# Patient Record
Sex: Male | Born: 1950 | Race: White | Hispanic: No | Marital: Single | Smoking: Never smoker
Health system: Southern US, Community
[De-identification: ages and names within clinical notes are randomized; demographics above are authoritative.]

## PROBLEM LIST (undated history)

## (undated) DIAGNOSIS — R0981 Nasal congestion: Secondary | ICD-10-CM

## (undated) DIAGNOSIS — I2510 Atherosclerotic heart disease of native coronary artery without angina pectoris: Secondary | ICD-10-CM

## (undated) DIAGNOSIS — E785 Hyperlipidemia, unspecified: Secondary | ICD-10-CM

## (undated) DIAGNOSIS — I5022 Chronic systolic (congestive) heart failure: Secondary | ICD-10-CM

## (undated) DIAGNOSIS — I219 Acute myocardial infarction, unspecified: Secondary | ICD-10-CM

## (undated) DIAGNOSIS — I493 Ventricular premature depolarization: Secondary | ICD-10-CM

## (undated) DIAGNOSIS — G629 Polyneuropathy, unspecified: Secondary | ICD-10-CM

## (undated) DIAGNOSIS — J45909 Unspecified asthma, uncomplicated: Secondary | ICD-10-CM

## (undated) DIAGNOSIS — J339 Nasal polyp, unspecified: Secondary | ICD-10-CM

## (undated) DIAGNOSIS — J301 Allergic rhinitis due to pollen: Secondary | ICD-10-CM

## (undated) DIAGNOSIS — I255 Ischemic cardiomyopathy: Secondary | ICD-10-CM

## (undated) HISTORY — DX: Chronic systolic (congestive) heart failure: I50.22

## (undated) HISTORY — DX: Polyneuropathy, unspecified: G62.9

## (undated) HISTORY — DX: Unspecified asthma, uncomplicated: J45.909

## (undated) HISTORY — DX: Ventricular premature depolarization: I49.3

## (undated) HISTORY — DX: Ischemic cardiomyopathy: I25.5

## (undated) HISTORY — DX: Nasal polyp, unspecified: J33.9

## (undated) HISTORY — DX: Atherosclerotic heart disease of native coronary artery without angina pectoris: I25.10

## (undated) HISTORY — DX: Acute myocardial infarction, unspecified: I21.9

## (undated) HISTORY — DX: Allergic rhinitis due to pollen: J30.1

## (undated) HISTORY — PX: NASAL POLYP SURGERY: SHX186

## (undated) HISTORY — DX: Nasal congestion: R09.81

## (undated) HISTORY — DX: Hyperlipidemia, unspecified: E78.5

## (undated) HISTORY — DX: Gilbert syndrome: E80.4

---

## 2012-12-28 ENCOUNTER — Inpatient Hospital Stay: Payer: Self-pay | Admitting: Internal Medicine

## 2012-12-28 DIAGNOSIS — I214 Non-ST elevation (NSTEMI) myocardial infarction: Secondary | ICD-10-CM

## 2012-12-28 LAB — CBC
HGB: 14.6 g/dL (ref 13.0–18.0)
MCHC: 34.3 g/dL (ref 32.0–36.0)
MCV: 94 fL (ref 80–100)
Platelet: 165 10*3/uL (ref 150–440)
RBC: 4.56 10*6/uL (ref 4.40–5.90)
RDW: 12.7 % (ref 11.5–14.5)
WBC: 10.2 10*3/uL (ref 3.8–10.6)

## 2012-12-28 LAB — URINALYSIS, COMPLETE
Glucose,UR: NEGATIVE mg/dL (ref 0–75)
Protein: NEGATIVE
WBC UR: 6 /HPF (ref 0–5)

## 2012-12-28 LAB — COMPREHENSIVE METABOLIC PANEL
Albumin: 3.6 g/dL (ref 3.4–5.0)
Alkaline Phosphatase: 99 U/L (ref 50–136)
Anion Gap: 7 (ref 7–16)
Bilirubin,Total: 1.3 mg/dL — ABNORMAL HIGH (ref 0.2–1.0)
Calcium, Total: 8.5 mg/dL (ref 8.5–10.1)
Co2: 25 mmol/L (ref 21–32)
Creatinine: 0.79 mg/dL (ref 0.60–1.30)
EGFR (African American): >60
EGFR (Non-African Amer.): >60
Glucose: 103 mg/dL — ABNORMAL HIGH (ref 65–99)
Osmolality: 274 (ref 275–301)
Potassium: 3.5 mmol/L (ref 3.5–5.1)
SGOT(AST): 116 U/L — ABNORMAL HIGH (ref 15–37)
SGPT (ALT): 47 U/L (ref 12–78)
Total Protein: 6.7 g/dL (ref 6.4–8.2)

## 2012-12-28 LAB — CK TOTAL AND CKMB (NOT AT ARMC)
CK, Total: 702 U/L — ABNORMAL HIGH (ref 35–232)
CK, Total: 1176 U/L — ABNORMAL HIGH (ref 35–232)
CK-MB: 169.8 ng/mL — ABNORMAL HIGH (ref 0.5–3.6)

## 2012-12-28 LAB — TROPONIN I: Troponin-I: 30.98 ng/mL — ABNORMAL HIGH

## 2012-12-29 DIAGNOSIS — I2510 Atherosclerotic heart disease of native coronary artery without angina pectoris: Secondary | ICD-10-CM

## 2012-12-29 HISTORY — PX: CARDIAC CATHETERIZATION: SHX172

## 2012-12-29 LAB — APTT
Activated PTT: 114.7 secs — ABNORMAL HIGH (ref 23.6–35.9)
Activated PTT: 116.3 secs — ABNORMAL HIGH (ref 23.6–35.9)
Activated PTT: 121 secs — ABNORMAL HIGH (ref 23.6–35.9)

## 2012-12-29 LAB — CBC WITH DIFFERENTIAL/PLATELET
Basophil #: 0 10*3/uL (ref 0.0–0.1)
Basophil %: 0.2 %
HCT: 41.6 % (ref 40.0–52.0)
HGB: 14.5 g/dL (ref 13.0–18.0)
Lymphocyte #: 1.1 10*3/uL (ref 1.0–3.6)
Lymphocyte %: 9.3 %
MCH: 32.8 pg (ref 26.0–34.0)
Monocyte #: 0.8 x10 3/mm (ref 0.2–1.0)
Monocyte %: 6.7 %
Platelet: 152 10*3/uL (ref 150–440)
RBC: 4.44 10*6/uL (ref 4.40–5.90)
WBC: 12.3 10*3/uL — ABNORMAL HIGH (ref 3.8–10.6)

## 2012-12-29 LAB — CK TOTAL AND CKMB (NOT AT ARMC)
CK, Total: 1226 U/L — ABNORMAL HIGH (ref 35–232)
CK, Total: 617 U/L — ABNORMAL HIGH (ref 35–232)
CK-MB: 134.2 ng/mL — ABNORMAL HIGH (ref 0.5–3.6)

## 2012-12-30 DIAGNOSIS — I214 Non-ST elevation (NSTEMI) myocardial infarction: Secondary | ICD-10-CM

## 2012-12-30 LAB — BASIC METABOLIC PANEL
Anion Gap: 8 (ref 7–16)
BUN: 11 mg/dL (ref 7–18)
Calcium, Total: 8 mg/dL — ABNORMAL LOW (ref 8.5–10.1)
Chloride: 108 mmol/L — ABNORMAL HIGH (ref 98–107)
Co2: 24 mmol/L (ref 21–32)
EGFR (African American): >60
EGFR (Non-African Amer.): >60
Osmolality: 278 (ref 275–301)
Sodium: 140 mmol/L (ref 136–145)

## 2012-12-30 LAB — PLATELET COUNT: Platelet: 129 10*3/uL — ABNORMAL LOW (ref 150–440)

## 2013-01-04 ENCOUNTER — Encounter: Payer: Self-pay | Admitting: *Deleted

## 2013-01-04 ENCOUNTER — Telehealth: Payer: Self-pay

## 2013-01-04 NOTE — Telephone Encounter (Signed)
Message copied by [facility], Jacobs Golab E on Wed Jan 04, 2013  9:20 AM ------      Message from: Iverson Alamin C      Created: Wed Jan 04, 2013  9:18 AM       TCM      Discharged from [facility]:01/01/13      Appointment: Kirke Corin 01/12/13      Records printed. ------

## 2013-01-04 NOTE — Telephone Encounter (Signed)
TCM  

## 2013-01-04 NOTE — Telephone Encounter (Signed)
Missed TCM window

## 2013-01-11 ENCOUNTER — Encounter: Payer: Self-pay | Admitting: Cardiovascular Disease

## 2013-01-11 ENCOUNTER — Other Ambulatory Visit: Payer: Self-pay

## 2013-01-11 ENCOUNTER — Ambulatory Visit (INDEPENDENT_AMBULATORY_CARE_PROVIDER_SITE_OTHER): Payer: BC Managed Care – PPO | Admitting: Physician Assistant

## 2013-01-11 ENCOUNTER — Encounter: Payer: Self-pay | Admitting: Physician Assistant

## 2013-01-11 VITALS — BP 100/68 | HR 60 | Ht 68.0 in | Wt 160.5 lb

## 2013-01-11 DIAGNOSIS — I2510 Atherosclerotic heart disease of native coronary artery without angina pectoris: Secondary | ICD-10-CM

## 2013-01-11 DIAGNOSIS — E785 Hyperlipidemia, unspecified: Secondary | ICD-10-CM | POA: Insufficient documentation

## 2013-01-11 DIAGNOSIS — I2589 Other forms of chronic ischemic heart disease: Secondary | ICD-10-CM

## 2013-01-11 DIAGNOSIS — J45901 Unspecified asthma with (acute) exacerbation: Secondary | ICD-10-CM

## 2013-01-11 DIAGNOSIS — J45909 Unspecified asthma, uncomplicated: Secondary | ICD-10-CM | POA: Insufficient documentation

## 2013-01-11 DIAGNOSIS — J441 Chronic obstructive pulmonary disease with (acute) exacerbation: Secondary | ICD-10-CM

## 2013-01-11 DIAGNOSIS — I255 Ischemic cardiomyopathy: Secondary | ICD-10-CM | POA: Insufficient documentation

## 2013-01-11 DIAGNOSIS — I429 Cardiomyopathy, unspecified: Secondary | ICD-10-CM

## 2013-01-11 MED ORDER — NITROGLYCERIN 0.4 MG SL SUBL: 0.4000 mg | SUBLINGUAL_TABLET | SUBLINGUAL | Status: AC | PRN

## 2013-01-11 MED ORDER — LISINOPRIL 5 MG PO TABS: 5.0000 mg | ORAL_TABLET | Freq: Every day | ORAL | Status: DC

## 2013-01-11 MED ORDER — METOPROLOL TARTRATE 25 MG PO TABS: 12.5000 mg | ORAL_TABLET | Freq: Two times a day (BID) | ORAL | Status: DC

## 2013-01-11 MED ORDER — PRASUGREL HCL 10 MG PO TABS: 10.0000 mg | ORAL_TABLET | Freq: Every day | ORAL | Status: DC

## 2013-01-11 MED ORDER — ATORVASTATIN CALCIUM 80 MG PO TABS: 80.0000 mg | ORAL_TABLET | Freq: Every day | ORAL | Status: DC

## 2013-01-11 NOTE — Assessment & Plan Note (Signed)
Stable. Continue ICS + SABA PRN.

## 2013-01-11 NOTE — Progress Notes (Signed)
Patient ID: [name] [name], male    DOB: [birth year], 62 y.o.   MRN: [id number]  HPI Comments: [name] [name] is a 62 yo male who presents today for post-hospital follow-up. 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  He presented to [facility] on 12/28/12 c/o chest discomfort. He had jumped off his bleachers and experienced mid scapular pain. He thought he pulled a muscle, but when the pain persisted, he presented to the ED. He ruled in for NSTEMI and underwent cardiac catheterization revealing 80% prox LAD confirmed by IVUS s/p DES, 90% diagonal s/p DES, 30% prox RCA; EF 40%, mild anterolateral HK and mod apical HK. DAPT- ASA/Effient x 12 months recommended. He tolerated the procedure well and was discharged on 12/31/12.  He was diagnosed with a UTI and discharged on Levaquin.   EKG demonstrates NSR, 60 bpm, ST dep with TWIs II, III, aVF (more pronounced from 01/24/2013 tracing), TWIs V5, V6, isolated 0.5 mm ST elevation aVL (unchanged from 01/24/13 tracing)  Labwork:  TSH 1.11 AST 116, LFTs otherwise normal  Lipid panel 10/31/12 LDL 103, HDL 56, TG 37, TC 167.   He has been doing well since. He finished the Levaquin course. No urinary changes. He has resumed usual activity level- mostly yardwork and chores around the house- without incident. He denies exertional chest pain, DOE/SOB, PND, orthopnea, LE edema or weight changes. Taking all discharge medications, tolerating well.    Outpatient Encounter Prescriptions as of 01/11/2013  Medication Sig Dispense Refill  . albuterol (PROAIR HFA) 108 (90 BASE) MCG/ACT inhaler Inhale 2 puffs into the lungs as needed for wheezing.      Marland Kitchen aspirin 81 MG tablet Take 81 mg by mouth daily.      Marland Kitchen atorvastatin (LIPITOR) 40 MG tablet Take 1 tablet (40 mg total) by mouth daily.  30 tablet  3  . beclomethasone (QVAR) 80 MCG/ACT inhaler Inhale 2 puffs into the lungs 2 (two) times daily.      . budesonide  (PULMICORT) 0.5 MG/2ML nebulizer solution Take 2 mLs by nebulization daily.      Marland Kitchen ibuprofen (ADVIL,MOTRIN) 200 MG tablet Take 200 mg by mouth every 4 (four) hours as needed for pain.      Marland Kitchen lisinopril (PRINIVIL,ZESTRIL) 5 MG tablet Take 1 tablet (5 mg total) by mouth daily.  30 tablet  3  . loratadine (CLARITIN) 10 MG tablet Take 10 mg by mouth daily.      . metoprolol tartrate (LOPRESSOR) 25 MG tablet Take 0.5 tablets (12.5 mg total) by mouth 2 (two) times daily.  30 tablet  3  . nitroGLYCERIN (NITROSTAT) 0.4 MG SL tablet Place 1 tablet (0.4 mg total) under the tongue every 5 (five) minutes as needed for chest pain.  25 tablet  3  . prasugrel (EFFIENT) 10 MG TABS Take 1 tablet (10 mg total) by mouth daily.  30 tablet  3                                                           No facility-administered encounter medications on file as of 01/11/2013.      Review of Systems  Constitutional: Negative.   HENT: Negative.   Eyes: Negative.   Respiratory: Negative.   Cardiovascular: Negative.   Gastrointestinal: Negative.  Endocrine: Negative.   Genitourinary: Negative.   Musculoskeletal: Negative.   Skin: Negative.   Allergic/Immunologic: Negative.   Neurological: Negative.   Hematological: Negative.   Psychiatric/Behavioral: Negative.       Physical Exam  Constitutional: He is oriented to person, place, and time. He appears well-developed and well-nourished. No distress.  HENT:  Head: Normocephalic and atraumatic.  Eyes: Conjunctivae and EOM are normal. Pupils are equal, round, and reactive to light.  Neck: Normal range of motion. Neck supple. No JVD present. No tracheal deviation present.  Cardiovascular: Normal rate, regular rhythm, normal heart sounds and intact distal pulses.  Exam reveals no gallop and no friction rub.   No murmur heard. Pulmonary/Chest: Effort normal and breath sounds normal. No stridor. No respiratory distress. He has no wheezes. He has no rales. He  exhibits no tenderness.  Abdominal: Soft. Bowel sounds are normal. He exhibits no distension and no mass. There is no tenderness. There is no rebound and no guarding.  Musculoskeletal: Normal range of motion. He exhibits no edema and no tenderness.  Neurological: He is alert and oriented to person, place, and time.  Skin: Skin is warm and dry. No rash noted. No erythema.  Psychiatric: He has a normal mood and affect. His behavior is normal. Judgment and thought content normal.

## 2013-01-11 NOTE — Patient Instructions (Addendum)
Please increase Lipitor to 80mg  daily. Finish current bottle of Lipitor. A prescription for 80mg  tablets has been sent to your pharmacy as well.   We will check labwork today looking at your potassium and kidney function.   Please call the office with any new symptoms since increasing the Lipitor, chest pain, breathing changes or swelling/weight changes.   We will plan to perform a repeat echocardiogram (heart ultrasound) in 3 months.   Please have liver markers checked at your primary doctor's office.

## 2013-01-12 ENCOUNTER — Encounter: Payer: BC Managed Care – PPO | Admitting: Cardiovascular Disease

## 2013-01-12 LAB — BASIC METABOLIC PANEL
BUN: 16 mg/dL (ref 8–27)
CO2: 26 mmol/L (ref 19–28)
Chloride: 102 mmol/L (ref 97–108)
Glucose: 87 mg/dL (ref 65–99)

## 2013-01-19 ENCOUNTER — Encounter: Payer: Self-pay | Admitting: Cardiovascular Disease

## 2013-02-19 ENCOUNTER — Encounter: Payer: Self-pay | Admitting: Cardiovascular Disease

## 2013-03-21 ENCOUNTER — Encounter: Payer: Self-pay | Admitting: Cardiovascular Disease

## 2013-04-18 ENCOUNTER — Other Ambulatory Visit: Payer: Self-pay

## 2013-04-18 ENCOUNTER — Other Ambulatory Visit (INDEPENDENT_AMBULATORY_CARE_PROVIDER_SITE_OTHER): Payer: BC Managed Care – PPO

## 2013-04-18 ENCOUNTER — Encounter: Payer: Self-pay | Admitting: Cardiovascular Disease

## 2013-04-18 DIAGNOSIS — I2510 Atherosclerotic heart disease of native coronary artery without angina pectoris: Secondary | ICD-10-CM

## 2013-04-18 DIAGNOSIS — I255 Ischemic cardiomyopathy: Secondary | ICD-10-CM

## 2013-05-19 ENCOUNTER — Ambulatory Visit (INDEPENDENT_AMBULATORY_CARE_PROVIDER_SITE_OTHER): Payer: BC Managed Care – PPO | Admitting: Cardiovascular Disease

## 2013-05-19 ENCOUNTER — Encounter: Payer: Self-pay | Admitting: Cardiovascular Disease

## 2013-05-19 VITALS — BP 108/68 | HR 56 | Ht 68.0 in | Wt 151.5 lb

## 2013-05-19 DIAGNOSIS — I2510 Atherosclerotic heart disease of native coronary artery without angina pectoris: Secondary | ICD-10-CM

## 2013-05-19 DIAGNOSIS — I2589 Other forms of chronic ischemic heart disease: Secondary | ICD-10-CM

## 2013-05-19 DIAGNOSIS — I255 Ischemic cardiomyopathy: Secondary | ICD-10-CM

## 2013-05-19 DIAGNOSIS — E785 Hyperlipidemia, unspecified: Secondary | ICD-10-CM

## 2013-05-19 NOTE — Patient Instructions (Addendum)
Continue same medications.  Follow up in 6 months.  

## 2013-05-19 NOTE — Progress Notes (Signed)
HPI   [name] is a 62 yo male who is here today for a followup visit regarding coronary artery disease with previous stenting of the LAD/diagonal. 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  He presented to [facility] on 12/28/12 c/o chest and mid scapular discomfort. He ruled in for NSTEMI and underwent cardiac catheterization revealing 80% prox LAD confirmed by IVUS s/p DES, 90% diagonal s/p DES, 30% prox RCA; EF 40%, mild anterolateral HK and mod apical HK. DAPT- ASA/Effient x 12 months recommended.  He has been doing very well since then with no recurrent symptoms of chest pain or dyspnea. Echocardiogram in July of 2014 showed an ejection fraction of 40-45% with mild mitral and aortic regurgitation.  No Known Allergies   Current Outpatient Prescriptions on File Prior to Visit  Medication Sig Dispense Refill  . albuterol (PROAIR HFA) 108 (90 BASE) MCG/ACT inhaler Inhale 2 puffs into the lungs as needed for wheezing.      Marland Kitchen aspirin 81 MG tablet Take 81 mg by mouth daily.      Marland Kitchen atorvastatin (LIPITOR) 80 MG tablet Take 1 tablet (80 mg total) by mouth daily.  30 tablet  3  . beclomethasone (QVAR) 80 MCG/ACT inhaler Inhale 2 puffs into the lungs 2 (two) times daily.      . budesonide (PULMICORT) 0.5 MG/2ML nebulizer solution Take 2 mLs by nebulization daily.      Marland Kitchen ibuprofen (ADVIL,MOTRIN) 200 MG tablet Take 200 mg by mouth every 4 (four) hours as needed for pain.      Marland Kitchen lisinopril (PRINIVIL,ZESTRIL) 5 MG tablet Take 1 tablet (5 mg total) by mouth daily.  30 tablet  3  . loratadine (CLARITIN) 10 MG tablet Take 10 mg by mouth daily.      . metoprolol tartrate (LOPRESSOR) 25 MG tablet Take 0.5 tablets (12.5 mg total) by mouth 2 (two) times daily.  30 tablet  3  . nitroGLYCERIN (NITROSTAT) 0.4 MG SL tablet Place 1 tablet (0.4 mg total) under the tongue every 5 (five) minutes as needed for chest pain.  25 tablet  3  . prasugrel (EFFIENT) 10 MG  TABS Take 1 tablet (10 mg total) by mouth daily.  30 tablet  3   No current facility-administered medications on file prior to visit.     Past Medical History  Diagnosis Date  . Hyperlipidemia   . Asthma   . Nasal sinus congestion   . Hay fever   . Nasal polyp   . Gilbert's disease     mild  . Neuropathy     left arm  . MI (myocardial infarction)   . Coronary artery disease     DES-prox LAD & DES-D1     Past Surgical History  Procedure Laterality Date  . Nasal polyp surgery      x2  . Cardiac catheterization  12/29/12    [facility];  80% prox LAD confirmed by IVUS s/p DES, 90% diagonal s/p DES, 30% prox RCA; EF 40%, mild anterolateral HK and mod apical HK     Family History  Problem Relation Age of Onset  . Heart disease Father   . Heart attack Father      History   Social History  . Marital Status: Single    Spouse Name: N/A    Number of Children: N/A  . Years of Education: N/A   Occupational History  . Not on file.   Social History Main Topics  .  Smoking status: Never Smoker   . Smokeless tobacco: Not on file  . Alcohol Use: No  . Drug Use: No  . Sexual Activity: Not on file   Other Topics Concern  . Not on file   Social History Narrative  . No narrative on file     PHYSICAL EXAM   BP 108/68  Pulse 56  Ht 5\' 8"  (1.727 m)  Wt 151 lb 8 oz (68.72 kg)  BMI 23.04 kg/m2 Constitutional: He is oriented to person, place, and time. He appears well-developed and well-nourished. No distress.  HENT: No nasal discharge.  Head: Normocephalic and atraumatic.  Eyes: Pupils are equal and round. Right eye exhibits no discharge. Left eye exhibits no discharge.  Neck: Normal range of motion. Neck supple. No JVD present. No thyromegaly present.  Cardiovascular: Normal rate, regular rhythm, normal heart sounds and. Exam reveals no gallop and no friction rub. No murmur heard.  Pulmonary/Chest: Effort normal and breath sounds normal. No stridor. No respiratory  distress. He has no wheezes. He has no rales. He exhibits no tenderness.  Abdominal: Soft. Bowel sounds are normal. He exhibits no distension. There is no tenderness. There is no rebound and no guarding.  Musculoskeletal: Normal range of motion. He exhibits no edema and no tenderness.  Neurological: He is alert and oriented to person, place, and time. Coordination normal.  Skin: Skin is warm and dry. No rash noted. He is not diaphoretic. No erythema. No pallor.  Psychiatric: He has a normal mood and affect. His behavior is normal. Judgment and thought content normal.       ASSESSMENT AND PLAN

## 2013-05-20 ENCOUNTER — Encounter: Payer: Self-pay | Admitting: Cardiovascular Disease

## 2013-05-20 NOTE — Assessment & Plan Note (Signed)
He is doing very well with no symptoms of angina. Continue current medications. He is to stay on dual antiplatelet therapy for at least one year.

## 2013-05-20 NOTE — Assessment & Plan Note (Signed)
Ejection fraction was still 40-45% after revascularization. Continue treatment with small dose metoprolol and lisinopril.

## 2013-05-20 NOTE — Assessment & Plan Note (Signed)
I reviewed his recent lipid profile which showed a total cholesterol of 115, triglyceride 35, HDL 47 and an LDL of 61. Liver function tests were normal. Creatinine was 0.9. TSH was normal. Continue high dose atorvastatin.

## 2013-05-24 ENCOUNTER — Encounter: Payer: Self-pay | Admitting: Cardiovascular Disease

## 2013-05-24 ENCOUNTER — Other Ambulatory Visit: Payer: Self-pay

## 2013-05-24 MED ORDER — LISINOPRIL 5 MG PO TABS: 5.0000 mg | ORAL_TABLET | Freq: Every day | ORAL | Status: DC

## 2013-05-24 MED ORDER — PRASUGREL HCL 10 MG PO TABS: 10.0000 mg | ORAL_TABLET | Freq: Every day | ORAL | Status: DC

## 2013-05-24 MED ORDER — METOPROLOL TARTRATE 25 MG PO TABS: 12.5000 mg | ORAL_TABLET | Freq: Two times a day (BID) | ORAL | Status: DC

## 2013-05-24 MED ORDER — ATORVASTATIN CALCIUM 80 MG PO TABS: 80.0000 mg | ORAL_TABLET | Freq: Every day | ORAL | Status: DC

## 2013-05-24 NOTE — Telephone Encounter (Signed)
Mychart says I have no refillable prescriptions   I need refills before next Tuesday   Lisinopril 5mg    Metoprolol tart 25mg    Atorvastatin 80mg    Effient 10mg     Thanks      Refill sent for above medications.

## 2013-06-14 ENCOUNTER — Encounter: Payer: Self-pay | Admitting: Cardiovascular Disease

## 2013-06-14 ENCOUNTER — Other Ambulatory Visit: Payer: Self-pay

## 2013-06-14 ENCOUNTER — Telehealth: Payer: Self-pay

## 2013-06-14 NOTE — Telephone Encounter (Signed)
Pt sent message via MyChart stating that he received his tdap and flu shots at [facility].

## 2013-07-13 ENCOUNTER — Telehealth: Payer: Self-pay

## 2013-07-13 NOTE — Telephone Encounter (Signed)
LMOM to inform of this.

## 2013-07-13 NOTE — Telephone Encounter (Signed)
Message copied by Coralee Rud on Thu Jul 13, 2013 10:05 AM ------      Message from: Lorine Bears A      Created: Thu Jul 13, 2013  9:24 AM      Regarding: RE: echo scheduled       He had an echo done in July. He does not need another one this soon. Who scheduled the echo?            ----- Message -----         From: Sandre Kitty         Sent: 07/13/2013   8:41 AM           To: Iran Ouch, MD      Subject: echo scheduled                                           This pt is scheduled for an echo next week. Not sure if he needs it, or why it was scheduled. Please advise and thank you.       ------

## 2013-07-19 ENCOUNTER — Other Ambulatory Visit: Payer: BC Managed Care – PPO

## 2013-07-27 ENCOUNTER — Other Ambulatory Visit: Payer: Self-pay

## 2013-09-11 ENCOUNTER — Other Ambulatory Visit: Payer: Self-pay | Admitting: *Deleted

## 2013-09-11 ENCOUNTER — Encounter: Payer: Self-pay | Admitting: Physician Assistant

## 2013-09-11 MED ORDER — PRASUGREL HCL 10 MG PO TABS: 10.0000 mg | ORAL_TABLET | Freq: Every day | ORAL | Status: DC

## 2013-09-11 MED ORDER — ATORVASTATIN CALCIUM 80 MG PO TABS: 80.0000 mg | ORAL_TABLET | Freq: Every day | ORAL | Status: DC

## 2013-09-11 MED ORDER — METOPROLOL TARTRATE 25 MG PO TABS: 12.5000 mg | ORAL_TABLET | Freq: Two times a day (BID) | ORAL | Status: DC

## 2013-09-11 MED ORDER — LISINOPRIL 5 MG PO TABS: 5.0000 mg | ORAL_TABLET | Freq: Every day | ORAL | Status: DC

## 2013-09-11 NOTE — Telephone Encounter (Signed)
Your requested prescriptions have been sent to your pharmacy and [place] [place] to you also.

## 2013-11-09 ENCOUNTER — Encounter: Payer: Self-pay | Admitting: Cardiovascular Disease

## 2013-11-09 ENCOUNTER — Ambulatory Visit (INDEPENDENT_AMBULATORY_CARE_PROVIDER_SITE_OTHER): Payer: BC Managed Care – PPO | Admitting: Cardiovascular Disease

## 2013-11-09 VITALS — BP 125/83 | HR 53 | Ht 68.0 in | Wt 152.5 lb

## 2013-11-09 DIAGNOSIS — I2510 Atherosclerotic heart disease of native coronary artery without angina pectoris: Secondary | ICD-10-CM

## 2013-11-09 DIAGNOSIS — I2589 Other forms of chronic ischemic heart disease: Secondary | ICD-10-CM

## 2013-11-09 DIAGNOSIS — E785 Hyperlipidemia, unspecified: Secondary | ICD-10-CM

## 2013-11-09 DIAGNOSIS — I255 Ischemic cardiomyopathy: Secondary | ICD-10-CM

## 2013-11-09 MED ORDER — CLOPIDOGREL BISULFATE 75 MG PO TABS
75.0000 mg | ORAL_TABLET | Freq: Every day | ORAL | Status: DC
Start: 2013-11-09 — End: 2014-08-10

## 2013-11-09 NOTE — Patient Instructions (Signed)
Start taking Plavix 75 mg once daily once you finish current supply of Effient.   Continue same medications.   Your physician wants you to follow-up in: 1 year.  You will receive a reminder letter in the mail two months in advance. If you don't receive a letter, please call our office to schedule the follow-up appointment.

## 2013-11-09 NOTE — Progress Notes (Signed)
HPI   [name] is a 63 yo male who is here today for a followup visit regarding coronary artery disease with previous stenting of the LAD/diagonal. 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 He [facility] 12/28/12 c/o chest and mid scapular discomfort. He ruled in for NSTEMI and underwent cardiac catheterization revealing 80% prox LAD confirmed by IVUS s/p DES, 90% diagonal s/p DES, 30% prox RCA; EF 40%, mild anterolateral HK and mod apical HK. DAPT- ASA/Effient x 12 months recommended.  He has been doing very well since then with no recurrent symptoms of chest pain or dyspnea. Echocardiogram in July of 2014 showed an ejection fraction of 40-45% with mild mitral and aortic regurgitation. He reports no exertional symptoms. He is taking his medications regularly.  No Known Allergies   Current Outpatient Prescriptions on File Prior to Visit  Medication Sig Dispense Refill  . albuterol (PROAIR HFA) 108 (90 BASE) MCG/ACT inhaler Inhale 2 puffs into the lungs as needed for wheezing.      Marland Kitchen. aspirin 81 MG tablet Take 81 mg by mouth daily.      Marland Kitchen. atorvastatin (LIPITOR) 80 MG tablet Take 1 tablet (80 mg total) by mouth daily.  30 tablet  6  . beclomethasone (QVAR) 80 MCG/ACT inhaler Inhale 2 puffs into the lungs 2 (two) times daily.      . budesonide (PULMICORT) 0.5 MG/2ML nebulizer solution Take 2 mLs by nebulization daily.      Marland Kitchen. ibuprofen (ADVIL,MOTRIN) 200 MG tablet Take 200 mg by mouth every 4 (four) hours as needed for pain.      . INFLUENZA A, H1N1, MONOVAL PF IM Inject into the muscle.      . lisinopril (PRINIVIL,ZESTRIL) 5 MG tablet Take 1 tablet (5 mg total) by mouth daily.  30 tablet  6  . loratadine (CLARITIN) 10 MG tablet Take 10 mg by mouth daily.      . metoprolol tartrate (LOPRESSOR) 25 MG tablet Take 0.5 tablets (12.5 mg total) by mouth 2 (two) times daily.  30 tablet  6  . nitroGLYCERIN (NITROSTAT) 0.4 MG SL tablet  Place 1 tablet (0.4 mg total) under the tongue every 5 (five) minutes as needed for chest pain.  25 tablet  3  . prasugrel (EFFIENT) 10 MG TABS tablet Take 1 tablet (10 mg total) by mouth daily.  30 tablet  6  . TDaP (ADACEL) 01-20-14.5 LF-MCG/0.5 injection Inject 0.5 mLs into the muscle once.       No current facility-administered medications on file prior to visit.     Past Medical History  Diagnosis Date  . Hyperlipidemia   . Asthma   . Nasal sinus congestion   . Hay fever   . Nasal polyp   . Gilbert's disease     mild  . Neuropathy     left arm  . MI (myocardial infarction)   . Coronary artery disease     DES-prox LAD & DES-D1     Past Surgical History  Procedure Laterality Date  . Nasal polyp surgery      x2  . Cardiac catheterization  12/29/12    [facility];  80% prox LAD confirmed by IVUS s/p DES, 90% diagonal s/p DES, 30% prox RCA; EF 40%, mild anterolateral HK and mod apical HK     Family History  Problem Relation Age of Onset  . Heart disease Father   . Heart attack Father  History   Social History  . Marital Status: Single    Spouse Name: N/A    Number of Children: N/A  . Years of Education: N/A   Occupational History  . Not on file.   Social History Main Topics  . Smoking status: Never Smoker   . Smokeless tobacco: Not on file  . Alcohol Use: No  . Drug Use: No  . Sexual Activity: Not on file   Other Topics Concern  . Not on file   Social History Narrative  . No narrative on file     PHYSICAL EXAM   BP 125/83  Pulse 53  Ht 5\' 8"  (1.727 m)  Wt 152 lb 8 oz (69.174 kg)  BMI 23.19 kg/m2 Constitutional: He is oriented to person, place, and time. He appears well-developed and well-nourished. No distress.  HENT: No nasal discharge.  Head: Normocephalic and atraumatic.  Eyes: Pupils are equal and round. Right eye exhibits no discharge. Left eye exhibits no discharge.  Neck: Normal range of motion. Neck supple. No JVD present. No  thyromegaly present.  Cardiovascular: Normal rate, regular rhythm, normal heart sounds and. Exam reveals no gallop and no friction rub. No murmur heard.  Pulmonary/Chest: Effort normal and breath sounds normal. No stridor. No respiratory distress. He has no wheezes. He has no rales. He exhibits no tenderness.  Abdominal: Soft. Bowel sounds are normal. He exhibits no distension. There is no tenderness. There is no rebound and no guarding.  Musculoskeletal: Normal range of motion. He exhibits no edema and no tenderness.  Neurological: He is alert and oriented to person, place, and time. Coordination normal.  Skin: Skin is warm and dry. No rash noted. He is not diaphoretic. No erythema. No pallor.  Psychiatric: He has a normal mood and affect. His behavior is normal. Judgment and thought content normal.     LKG:MWNUU  Bradycardia  - frequent PAC s  # PACs = 2. Nonspecific ST changes  ABNORMAL    ASSESSMENT AND PLAN

## 2013-11-09 NOTE — Assessment & Plan Note (Signed)
He is doing very well with no symptoms suggestive of angina. I will plan on treating him with dual antiplatelet therapy for at least 2 years if tolerated. On switching him from Effient to Plavix 75 mg once daily once he finishes current supplies of Effient.

## 2013-11-09 NOTE — Assessment & Plan Note (Signed)
I reviewed his recent labs which overall were unremarkable. Total cholesterol is 123, HDL 57, triglycerides 44 and LDL was 58. Continue treatment with high dose atorvastatin. AST was slightly elevated at 49 but not enough to decrease the dose of atorvastatin.

## 2013-11-09 NOTE — Assessment & Plan Note (Signed)
Most recent ejection fraction was 40%. Continue treatment with metoprolol and lisinopril. He has no symptoms suggestive of heart failure.

## 2014-03-06 ENCOUNTER — Encounter: Payer: Self-pay | Admitting: Cardiovascular Disease

## 2014-04-17 ENCOUNTER — Other Ambulatory Visit: Payer: Self-pay

## 2014-04-17 ENCOUNTER — Encounter: Payer: Self-pay | Admitting: Cardiovascular Disease

## 2014-04-17 MED ORDER — METOPROLOL TARTRATE 25 MG PO TABS: 12.5000 mg | ORAL_TABLET | Freq: Two times a day (BID) | ORAL | Status: DC

## 2014-04-17 MED ORDER — ATORVASTATIN CALCIUM 80 MG PO TABS: 80.0000 mg | ORAL_TABLET | Freq: Every day | ORAL | Status: DC

## 2014-04-17 MED ORDER — LISINOPRIL 5 MG PO TABS
5.0000 mg | ORAL_TABLET | Freq: Every day | ORAL | Status: DC
Start: 2014-04-17 — End: 2014-11-19

## 2014-04-17 NOTE — Telephone Encounter (Signed)
Refill sent for metoprolol, atorvastatin and lisinopril.

## 2014-05-13 ENCOUNTER — Ambulatory Visit: Payer: Self-pay | Admitting: Physician Assistant

## 2014-05-14 ENCOUNTER — Ambulatory Visit: Payer: Self-pay | Admitting: Physician Assistant

## 2014-05-15 ENCOUNTER — Ambulatory Visit: Payer: Self-pay | Admitting: Emergency Medicine

## 2014-05-16 ENCOUNTER — Ambulatory Visit: Payer: Self-pay | Admitting: Internal Medicine

## 2014-08-10 ENCOUNTER — Encounter: Payer: Self-pay | Admitting: Cardiovascular Disease

## 2014-08-10 NOTE — Telephone Encounter (Signed)
Informed patient of Dr. Aridas response  Patient verbalized understanding  

## 2014-11-09 ENCOUNTER — Ambulatory Visit (INDEPENDENT_AMBULATORY_CARE_PROVIDER_SITE_OTHER): Payer: Medicare PPO | Admitting: Cardiovascular Disease

## 2014-11-09 ENCOUNTER — Encounter: Payer: Self-pay | Admitting: Cardiovascular Disease

## 2014-11-09 VITALS — BP 108/70 | HR 67 | Ht 68.0 in | Wt 157.0 lb

## 2014-11-09 DIAGNOSIS — E785 Hyperlipidemia, unspecified: Secondary | ICD-10-CM

## 2014-11-09 DIAGNOSIS — I2510 Atherosclerotic heart disease of native coronary artery without angina pectoris: Secondary | ICD-10-CM

## 2014-11-09 DIAGNOSIS — I255 Ischemic cardiomyopathy: Secondary | ICD-10-CM

## 2014-11-09 NOTE — Assessment & Plan Note (Signed)
Most recent lipid profile in October showed an LDL of 88 and triglyceride of 50. Continue atorvastatin 80 mg once daily. I discussed with him the importance of diet and exercise in order to achieve an LDL of less than 70. Treatment with Zetia can be considered.

## 2014-11-09 NOTE — Assessment & Plan Note (Signed)
Most recent ejection fraction was 40%. Continue treatment with metoprolol and lisinopril. He has no symptoms suggestive of heart failure.

## 2014-11-09 NOTE — Assessment & Plan Note (Signed)
He is doing very well with no symptoms suggestive of angina. Continue medical therapy. Continue aspirin indefinitely. Plavix was discontinued in November.

## 2014-11-09 NOTE — Patient Instructions (Signed)
Continue same medications.   Your physician wants you to follow-up in: 1 year.  You will receive a reminder letter in the mail two months in advance. If you don't receive a letter, please call our office to schedule the follow-up appointment.  

## 2014-11-09 NOTE — Progress Notes (Signed)
HPI   [name] [name] is a 64 yo male who is here today for a followup visit regarding coronary artery disease with previous stenting of the LAD/diagonal. 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 He [facility] on 12/28/12 c/o chest and mid scapular discomfort. He ruled in for NSTEMI and underwent cardiac catheterization revealing 80% prox LAD confirmed by IVUS s/p DES, 90% diagonal s/p DES, 30% prox RCA; EF 40%, mild anterolateral HK and mod apical HK.   He has been doing very well since then with no recurrent symptoms of chest pain or dyspnea. Echocardiogram in July of 2014 showed an ejection fraction of 40-45% with mild mitral and aortic regurgitation. He reports no exertional symptoms. He is taking his medications regularly. Plavix was discontinued in November 2015.  No Known Allergies   Current Outpatient Prescriptions on File Prior to Visit  Medication Sig Dispense Refill  . albuterol (PROAIR HFA) 108 (90 BASE) MCG/ACT inhaler Inhale 2 puffs into the lungs as needed for wheezing.    Marland Kitchen. aspirin 81 MG tablet Take 81 mg by mouth daily.    Marland Kitchen. atorvastatin (LIPITOR) 80 MG tablet Take 1 tablet (80 mg total) by mouth daily. 30 tablet 6  . beclomethasone (QVAR) 80 MCG/ACT inhaler Inhale 2 puffs into the lungs 2 (two) times daily.    . budesonide (PULMICORT) 0.5 MG/2ML nebulizer solution Take 2 mLs by nebulization 2 (two) times a week.     Marland Kitchen. ibuprofen (ADVIL,MOTRIN) 200 MG tablet Take 200 mg by mouth every 4 (four) hours as needed for pain.    . INFLUENZA A, H1N1, MONOVAL PF IM Inject into the muscle.    . lisinopril (PRINIVIL,ZESTRIL) 5 MG tablet Take 1 tablet (5 mg total) by mouth daily. 30 tablet 6  . loratadine (CLARITIN) 10 MG tablet Take 10 mg by mouth daily.    . metoprolol tartrate (LOPRESSOR) 25 MG tablet Take 0.5 tablets (12.5 mg total) by mouth 2 (two) times daily. 30 tablet 6  . nitroGLYCERIN (NITROSTAT) 0.4 MG SL tablet  Place 1 tablet (0.4 mg total) under the tongue every 5 (five) minutes as needed for chest pain. 25 tablet 3  . TDaP (ADACEL) 01-20-14.5 LF-MCG/0.5 injection Inject 0.5 mLs into the muscle once.     No current facility-administered medications on file prior to visit.     Past Medical History  Diagnosis Date  . Hyperlipidemia   . Asthma   . Nasal sinus congestion   . Hay fever   . Nasal polyp   . Gilbert's disease     mild  . Neuropathy     left arm  . MI (myocardial infarction)   . Coronary artery disease     DES-prox LAD & DES-D1     Past Surgical History  Procedure Laterality Date  . Nasal polyp surgery      x2  . Cardiac catheterization  12/29/12    [facility];  80% prox LAD confirmed by IVUS s/p DES, 90% diagonal s/p DES, 30% prox RCA; EF 40%, mild anterolateral HK and mod apical HK     Family History  Problem Relation Age of Onset  . Heart disease Father   . Heart attack Father      History   Social History  . Marital Status: Single    Spouse Name: N/A  . Number of Children: N/A  . Years of Education: N/A   Occupational History  . Not on file.  Social History Main Topics  . Smoking status: Never Smoker   . Smokeless tobacco: Not on file  . Alcohol Use: No  . Drug Use: No  . Sexual Activity: Not on file   Other Topics Concern  . Not on file   Social History Narrative     PHYSICAL EXAM   BP 108/70 mmHg  Pulse 67  Ht  (1.727 m)  Wt 157 lb (71.215 kg)  BMI 23.88 kg/m2 Constitutional: He is oriented to person, place, and time. He appears well-developed and well-nourished. No distress.  HENT: No nasal discharge.  Head: Normocephalic and atraumatic.  Eyes: Pupils are equal and round. Right eye exhibits no discharge. Left eye exhibits no discharge.  Neck: Normal range of motion. Neck supple. No JVD present. No thyromegaly present.  Cardiovascular: Normal rate, regular rhythm, normal heart sounds and. Exam reveals no gallop and no friction rub. No  murmur heard.  Pulmonary/Chest: Effort normal and breath sounds normal. No stridor. No respiratory distress. He has no wheezes. He has no rales. He exhibits no tenderness.  Abdominal: Soft. Bowel sounds are normal. He exhibits no distension. There is no tenderness. There is no rebound and no guarding.  Musculoskeletal: Normal range of motion. He exhibits no edema and no tenderness.  Neurological: He is alert and oriented to person, place, and time. Coordination normal.  Skin: Skin is warm and dry. No rash noted. He is not diaphoretic. No erythema. No pallor.  Psychiatric: He has a normal mood and affect. His behavior is normal. Judgment and thought content normal.     EKG: Sinus  Rhythm  WITHIN NORMAL LIMITS   ASSESSMENT AND PLAN

## 2014-11-19 ENCOUNTER — Encounter: Payer: Self-pay | Admitting: Cardiovascular Disease

## 2014-11-19 ENCOUNTER — Telehealth: Payer: Self-pay

## 2014-11-19 MED ORDER — LISINOPRIL 5 MG PO TABS: 5.0000 mg | ORAL_TABLET | Freq: Every day | ORAL | Status: DC

## 2014-11-19 MED ORDER — ATORVASTATIN CALCIUM 80 MG PO TABS: 80.0000 mg | ORAL_TABLET | Freq: Every day | ORAL | Status: DC

## 2014-11-19 MED ORDER — METOPROLOL TARTRATE 25 MG PO TABS: 12.5000 mg | ORAL_TABLET | Freq: Two times a day (BID) | ORAL | Status: DC

## 2014-11-19 NOTE — Telephone Encounter (Signed)
If this is a duplicate of a just sent msg please excuse     Refills for    Atorvastatin 80mg     Metoprolol Tart 25mg     Lisinopril 5mg     Refill sent for atorvastatin, metoprolol and lisinopril.

## 2015-01-11 NOTE — Discharge Summary (Signed)
PATIENT [place]:  [name], [name] MR#:  [id number] DATE OF BIRTH:  [birth year]  DATE OF ADMISSION:  12/28/2012 DATE OF DISCHARGE:  12/31/2012  ADMITTING PHYSICIAN:  Dr. Katharina Caperima Vaickute.    DISCHARGING PHYSICIAN: Cherly Hensenadhika Palisade, M.D.   PRIMARY CARE PHYSICIAN:  [name].  CONSULTATIONS IN THE HOSPITAL: Cardiology consultation by Dr. Kirke CorinArida.   DISCHARGE DIAGNOSES: 1.  Non-ST segment elevation myocardial infarction requiring left anterior descending artery and diagonal stents.  2.  Hyperlipidemia.  3.  Asthma.  4.  Urinary tract infection.  5.  Ischemic cardiomyopathy after his myocardial infarction with ejection fraction of 40%.   DISCHARGE MEDICATIONS: 1.  ProAir inhaler 2 puffs 4 times a day as needed for wheezing.  2.  Loratadine 10 mg p.o. daily.  3.  Ibuprofen 200 mg q.4h. p.r.n. for pain.  4.  Qvar 80 mcg inhalation aerosol 2 puffs once a day.  5.  Budesonide 0.5 mg per 2 mL inhalation, 2 mL once a day.  6.  Lisinopril 5 mg p.o. daily.  7.  Atorvastatin 40 mg p.o. at bedtime.  8.  Aspirin 81 mg p.o. daily.  9.  Metoprolol 12.5 mg p.o. b.i.d.  10.  Prasugrel 10 mg p.o. daily.  11.  Levaquin 250 mg p.o. daily for 2 more days.  12.  Nitroglycerin sublingual tablet 0.4 mg every 5 minutes as needed for chest pain.   DISCHARGE DIET: Low sodium and low fat diet.   DISCHARGE ACTIVITY: As tolerated.    FOLLOWUP INSTRUCTIONS:  PCP followup in 3 to 4 weeks and cardiology followup with Dr. Kirke CorinArida in 2 weeks.   LABS AND IMAGING STUDIES:   1.  Sodium 140, potassium 3.5, chloride 108, bicarbonate 24, BUN 11, creatinine 0.75, glucose 93 and calcium 8.0.  2.  WBC 12.3, hemoglobin 14.5, hematocrit 41.6, platelet count 152.  3.  Troponins were elevated greater than 40 on admission.  4.  Chest x-ray showing clear lung fields. No evidence of CHF or pneumonia.   BRIEF HOSPITAL COURSE: The patient is a 64 year old male with past medical history significant for asthma and hyperlipidemia who  presented to the hospital secondary to chest pain and pain between his shoulder blades, typical angina symptoms.   1.  Non-ST segment elevation MI. The patient comes with atypical angina symptoms and found to have increasing troponin so he was started on IV heparin and cardiology was consulted. He did have a cardiac cath showing blockages up to 90% in his LAD and diagonal and received 2 drug-eluding stents. Post catheterization, he was placed on aspirin, Effient, beta blocker, lisinopril and also statin. Because of the nature of the infarct that he had, he was monitored for about 48 hours after catheterization to make sure there no arrhythmias and was discharged home in a stable condition. His EF is down to 40% and he will have an outpatient followup with Dr. Kirke CorinArida in 2 weeks. He is advised to continue his medications. The patient had a right radial artery access for his catheterization.  2.  Asthma. His symptoms were stable. He did not have any acute exacerbation while in the hospital. All his inhalers were continued.  3.  UTI. The patient did have some dysuria symptoms, improved with antibiotics. He was on Levaquin in the hospital and will finish off his course by taking for 2 more days.   His course has been otherwise uneventful in the hospital.   DISCHARGE CONDITION: Stable.   DISCHARGE DISPOSITION: Home.   TIME SPENT ON  DISCHARGE: 45 minutes.    ____________________________ Enid Baas, MD rk:cs D: 01/01/2013 11:37:00 ET T: 01/01/2013 14:17:07 ET JOB#: [id number]  cc: Enid Baas, MD, <Dictator> [name], MD Mickie Hillier Sheppard Penton, MD Enid Baas MD ELECTRONICALLY SIGNED 01/06/2013 15:42

## 2015-01-11 NOTE — H&P (Signed)
PATIENT NAME:  [name] [name], [name] MR#:  [id number] DATE OF BIRTH:  07/12/1951  DATE OF ADMISSION:  12/28/2012  PRIMARY CARE PHYSICIAN: Dr. Cay SchillingsJack [name]   HISTORY OF PRESENT ILLNESS: The patient is a 64 year old Caucasian male with past medical history significant for history of hyperlipidemia, history of asthma, history of nasal sinus problems who presented to the hospital with complaints of chest pains.  According to the patient, he was doing well up until approximately 10:30 p.m. on yesterday when he jumped down from some unclear height, and he felt that he injured his back. He started having pain in between his shoulder blades.  It happened at around 10:30 p.m. He felt as if it was achy pain 6 out of 10 by intensity, intermittent. He tried to apply some cold application with some improvement of his pains. He also admitted to having some shortness of breath, however, felt that his shortness of breath did not change significantly since he has chronic asthma.  He was also having some pains and achiness in his chest across his chest the same time, as well as some discomfort in his right arm.  Today at around 2:30 a.m., he woke up soaking in sweat.  He felt somewhat faint and decided to come to the Emergency Room because he was having soreness across his chest. In the Emergency Room, he had EKG done which showed ST depressions in inferior leads. His troponin was checked and was found to be elevated at 11.0.  Hospitalist services were contacted for admission and the cardiologist was consulted.  The cardiologist, Dr. Mariah [name], saw the patient in consultation and felt the patient would benefit from cardiac catheterization, which was tentatively scheduled for tomorrow.   PAST MEDICAL HISTORY: Significant for hyperlipidemia, asthma, nasal sinus problems.   MEDICATIONS: According to medical records, the patient is on: 1. Atorvastatin 40 mg p.o. daily.  2. Budesonide 0.5 mg in 2 mL inhalation solution, 2 mL once a day.   3. Ibuprofen 200 mg p.o. every 4 hours as needed for headaches as well as back pains.  4. Loratadine 10 mg p.o. once daily.  5. ProAir HFA 2 puffs 4 times daily as needed.  6. Qvar 80 mcg inhalation aerosol 2 puffs once a day.   ALLERGIES: None.   PAST SURGICAL HISTORY: Nasal polyp surgeries x 2 in 1998 and 2001.  FAMILY HISTORY: Negative for coronary artery disease, hypertension, diabetes or stroke. The  patient's father had coronary artery disease and bypass in his late 1850s or 260s. The patient's mother had liver problems. Sister had disease which was comparable to MS, as well as brother had the same problem. The patient's other sister died of flu complications at the age of 66.   SOCIAL HISTORY:  The patient is divorced, has 1 son who is 66 years old, lives in [name].  He used to smoke in his 7220s; however, he quit a long time ago.  No alcohol abuse.  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   REVIEW OF SYSTEMS: Positive for chills last night, feeling cold, somewhat fatigued and weak, pains in his chest as well as his back, seasonal allergies, postnasal drip, some asthma, shortness of breath, intermittent wheezing as well as cough, chest pains, dizziness, unsteadiness on his feet, some nausea, dry heaving earlier today as well as diarrhea all day today. Right wrist discomfort for the past one month, also hyperlipidemia. His pravastatin recently was changed to Lipitor for approximately a month ago because of unreached lipid  target.  CONSTITUTIONAL: Otherwise, denies any fevers, weakness, weight loss or gain.   EYES: Denies any blurry vision, double vision, glaucoma or cataracts.  ENT: Denies any tinnitus, sinuses, dentures, difficulty swallowing.  RESPIRATORY: Denies any hemoptysis, painful respirations.  CARDIOVASCULAR: Denies orthopnea, edema, arrhythmias, palpitations or syncope. GASTROINTESTINAL: Denies any vomiting, hematemesis, rectal bleeding or change of bowel  habits.  GENITOURINARY: Denies dysuria, hematuria, frequency or incontinence.  ENDOCRINOLOGY: Denies any polydipsia, nocturia, thyroid problems, heat or cold intolerance or thirst.  HEMATOLOGIC: Denies anemia, easy bruising, bleeding or swollen glands.  SKIN: Denies any acne, rashes, lesions or change in moles.  MUSCULOSKELETAL: Denies arthritis, cramps, swelling or gout.  NEUROLOGIC: Denies numbness, epilepsy or tremor.  PSYCHIATRIC: Denies anxiety, insomnia or depression.    PHYSICAL EXAMINATION: VITAL SIGNS: On arrival to the hospital, temperature is 98.3, pulse 58, respiration rate was 18, blood pressure was 137/79, saturation 99 percent on room air.  GENERAL:  He is a well-developed, well-nourished Caucasian male in no significant distress, comfortable on the stretcher.  HEENT: His pupils are equal and reactive to light. Extraocular movements are intact. No icterus or conjunctivitis.  Has normal hearing.  No pharyngeal erythema. Mucosa is moist.  NECK: No masses, supple, nontender. Thyroid is not enlarged. No adenopathy. No JVD or carotid bruits bilaterally.  Full range of motion.  LUNGS: Clear to auscultation in all fields. A few rhonchi and rales were heard , the most on the right side, somewhat diminished breath sounds on the right but no wheezing.  No labored inspirations, increased effort, dullness to percussion or overt respiratory distress.  CARDIOVASCULAR: S1, S2 appreciated. No murmurs, gallops or rubs noted. Rhythm was regular. PMI is not enlarged. Chest is nontender to palpation.  EXTREMITIES: 1+ pedal pulses, no lower extremity edema, calf tenderness or cyanosis.   ABDOMEN: Soft, nontender. Bowel sounds are present. No organomegaly or  masses were noted.  RECTAL: Deferred.  MUSCLE STRENGTH: Able to move all extremities. No cyanosis, degenerative joint disease or kyphosis.  SKIN:  Denies any rashes, lesions, erythema, nodularity, induration. It was warm and dry to palpation.   LYMPH: No adenopathy in the cervical region.  NEUROLOGICAL: Cranial nerves grossly intact. Sensory is intact. No dysarthria or aphasia. PSYCHIATRIC: The patient is alert and oriented to time, person, place, cooperative. Memory is good. No confusion, agitation or depression noted.   LABORATORY AND RADIOLOGICAL DATA:  EKGs x 2 were done in the Emergency Room.  The first one, which was done at around 10:00 a.m.,  revealed ST depressions in inferior leads, otherwise no acute abnormalities were noted. Repeated EKG done at around noon on the same day, 12/28/2012, revealed improvement of ST depressions in inferior leads, otherwise normal sinus rhythm at a rate of 65 beats per minute, normal axis, nonspecific ST-T changes otherwise were noted. Chest x-ray, PA and lateral, 12/28/2012 showed no evidence of CHF or pneumonia, no evidence of pneumothorax, trace of fluid in the posterior costophrenic gutter on the left.   The patient's lab data:   BMP is showed glucose of 103, otherwise unremarkable. The patient's liver enzymes revealed elevated AST to 116, total bilirubin 1.3, otherwise unremarkable. liver enzymes.  Troponin is elevated at 11.0, however, CK as well as MB fractions are not available.  CBC is within normal limits with white blood cell count 10.2, hemoglobin 14.6, platelet count 165.  Urinalysis:  Yellow cloudy urine, negative for glucose, bilirubin, 1+ ketones, specific gravity 1.021, pH was 7.0 and negative for blood or protein, positive  for nitrites, 1+ leukocyte esterase, less than 1 red blood cell, 6 white blood cells, 2+ bacteria, no epithelial cells, however,  mucus is present.    ASSESSMENT AND PLAN: 1. Acute myocardial infarction:  Admit the patient to the medical floor. Start him on beta blockers, heparin IV, aspirin as well as nitroglycerin. Get cardiology consultation in the morning and cardiac catheterization hopefully tomorrow.  2. Hyperlipidemia: Continue Lipitor, get lipid panel in the  morning.  3. Asthma: Continue inhalers as well as nebulizers.  4. Back pain:  No nonsteroidal anti-inflammatory medications. Tylenol as needed.  5. Urinary tract infection: Get urine cultures. Start the patient on Levaquin.  6. Hyperglycemia:  Check hemoglobin A1c to rule out diabetes mellitus.  7. Elevated transaminases of unclear etiology at this time:  Get CK levels. The patient is otherwise asymptomatic, and repeat the patient's liver tests in the morning.   TIME SPENT: 50 minutes.    ____________________________ Katharina Caper, MD rv:cb D: 12/28/2012 15:30:46 ET T: 12/28/2012 16:21:19 ET JOB#: [id number]  cc: Katharina Caper, MD, <Dictator> Mickie Hillier. Sheppard Penton, MD Katharina Caper MD ELECTRONICALLY SIGNED 01/30/2013 18:15

## 2015-01-11 NOTE — Consult Note (Signed)
General Aspect 64 year old Caucasian male with past medical history significant for history of hyperlipidemia, asthma,   who presented to the hospital with complaints of chest pains.  Cardiology was consulted for elevated cardiac enz, abn ekg, chest pain consistent with NSTEMI.  he was doing well up until approximately 10:30 p.m. on yesterday when he jumped from elevated height and hurt his back with pain developing between his shoulder blades.  He  admitted to having some shortness of breath,  some pains and achiness in his chest,  as well as some discomfort in his right arm.  last night at around 2:30 a.m., he woke up soaking in sweat.  He felt faint and decided to come to the Emergency Room because he was having soreness across his chest.   In the Emergency Room,  EKG showed done ST depressions in inferior leads, anterolateral leads. His troponin was elevated at 11.0.  Pain improved in the ER. Heparin bolus given.   Present Illness . FAMILY HISTORY:  The  patient's father had coronary artery disease and bypass in his late 20s or 43s. The patient's mother had liver problems. Sister had disease which was comparable to MS, as well as brother had the same problem. The patient's other sister died of flu complications at the age of 3.   SOCIAL HISTORY:   The patient is divorced, has 1 son who is 39 years old, lives in [place].  He used to smoke in his 78s; however, he quit a long time ago.  No alcohol abuse.  He works for [employer], Secondary school teacher.   Physical Exam:  GEN well developed, well nourished, no acute distress   HEENT hearing intact to voice, moist oral mucosa   NECK supple   RESP normal resp effort  clear BS   CARD Regular rate and rhythm  No murmur   ABD denies tenderness  soft   LYMPH negative neck   EXTR negative edema   SKIN normal to palpation   NEURO motor/sensory function intact   PSYCH alert, A+O to time, place, person, good insight    Review of Systems:  Subjective/Chief Complaint chest pain   General: tired   Skin: No Complaints   ENT: No Complaints   Eyes: No Complaints   Neck: No Complaints   Respiratory: No Complaints   Cardiovascular: Chest pain or discomfort  now resolved   Gastrointestinal: No Complaints   Genitourinary: No Complaints   Vascular: No Complaints   Musculoskeletal: No Complaints   Neurologic: No Complaints   Hematologic: No Complaints   Endocrine: No Complaints   Psychiatric: No Complaints   Review of Systems: All other systems were reviewed and found to be negative   Medications/Allergies Reviewed Medications/Allergies reviewed     nasel polyps:    Asthma:    Hypercholesterolemia:    Nose Surgery:        Admit Diagnosis:   AMI: Onset Date: 28-Dec-2012, Status: Active, Description: AMI  Home Medications: Medication Instructions Status  ProAir HFA CFC free 90 mcg/inh inhalation aerosol 2 puff(s) inhaled 4 times a day, As Needed - for Shortness of Breath Active  loratadine 10 mg oral tablet 1 tab(s) orally once a day Active  ibuprofen 200 mg oral tablet 1 tab(s) orally every 4 hours, As Needed - for Pain Active  atorvastatin 40 mg oral tablet 1 tab(s) orally once a day (at bedtime) Active  Qvar 80 mcg/inh inhalation aerosol 2 puff(s) inhaled once a day Active  budesonide 0.5  mg/2 mL inhalation suspension 2 milliliter(s) inhaled once a day Active   Lab Results:  Thyroid:  09-Apr-14 12:17   Thyroid Stimulating Hormone 1.11 (0.45-4.50 (International Unit)  ----------------------- Pregnant patients have  different reference  ranges for TSH:  - - - - - - - - - -  Pregnant, first trimetser:  0.36 - 2.50 uIU/mL)  Hepatic:  09-Apr-14 12:17   Bilirubin, Total  1.3  Alkaline Phosphatase 99  SGPT (ALT) 47  SGOT (AST)  116  Total Protein, Serum 6.7  Albumin, Serum 3.6  Routine Chem:  09-Apr-14 12:17   Glucose, Serum  103  BUN 12  Creatinine (comp) 0.79   Sodium, Serum 137  Potassium, Serum 3.5  Chloride, Serum 105  CO2, Serum 25  Calcium (Total), Serum 8.5  Osmolality (calc) 274  eGFR (African American) >60  eGFR (Non-African American) >60 (eGFR values <58m/min/1.73 m2 may be an indication of chronic kidney disease (CKD). Calculated eGFR is useful in patients with stable renal function. The eGFR calculation will not be reliable in acutely ill patients when serum creatinine is changing rapidly. It is not useful in  patients on dialysis. The eGFR calculation may not be applicable to patients at the low and high extremes of body sizes, pregnant women, and vegetarians.)  Anion Gap 7  Result Comment TROPONIN - RESULTS VERIFIED BY REPEAT TESTING.  - [place] [address] - 12/28/12-DAC  - READ-BACK PROCESS PERFORMED.  Result(s) reported on 28 Dec 2012 at 01:39PM.  Cardiac:  09-Apr-14 12:17   Troponin I  11.00 (0.00-0.05 0.05 ng/mL or less: NEGATIVE  Repeat testing in 3-6 hrs  if clinically indicated. >0.05 ng/mL: POTENTIAL  MYOCARDIAL INJURY. Repeat  testing in 3-6 hrs if  clinically indicated. NOTE: An increase or decrease  of 30% or more on serial  testing suggests a  clinically important change)  Routine UA:  09-Apr-14 12:17   Color (UA) Yellow  Clarity (UA) Cloudy  Glucose (UA) Negative  Bilirubin (UA) Negative  Ketones (UA) 1+  Specific Gravity (UA) 1.021  Blood (UA) Negative  pH (UA) 7.0  Protein (UA) Negative  Nitrite (UA) Positive  Leukocyte Esterase (UA) 1+ (Result(s) reported on 28 Dec 2012 at 01:38PM.)  RBC (UA) <1 /HPF  WBC (UA) 6 /HPF  Bacteria (UA) 2+  Epithelial Cells (UA) NONE SEEN  Mucous (UA) PRESENT (Result(s) reported on 28 Dec 2012 at 01:38PM.)  Routine Coag:  09-Apr-14 12:17   Activated PTT (APTT)  36.7 (A HCT value >55% may artifactually increase the APTT. In one study, the increase was an average of 19%. Reference: "Effect on Routine and Special Coagulation Testing  Values of Citrate Anticoagulant Adjustment in Patients with High HCT Values." American Journal of Clinical Pathology 2006;126:400-405.)  Routine Hem:  09-Apr-14 12:17   WBC (CBC) 10.2  RBC (CBC) 4.56  Hemoglobin (CBC) 14.6  Hematocrit (CBC) 42.7  Platelet Count (CBC) 165 (Result(s) reported on 28 Dec 2012 at 01:01PM.)  MCV 94  MCH 32.1  MCHC 34.3  RDW 12.7   EKG:  Interpretation NSR with ST abn in inferior leads, possibly in anterolateral leads   Radiology Results: XRay:    09-Apr-14 12:39, Chest PA and Lateral  Chest PA and Lateral   REASON FOR EXAM:    chest pain  COMMENTS:   May transport without cardiac monitor    PROCEDURE: DXR - DXR CHEST PA (OR AP) AND LATERAL  - Dec 28 2012 12:39PM  RESULT: The lungs are well-expanded. On the lateral film there is mild   hemidiaphragm flattening and a trace of blunting of the posterior   costophrenic gutter on the left. The cardiac silhouette is normal in   size. There is tortuosity of the descending thoracic aorta. Pulmonary   vascularity is not engorged.    IMPRESSION:   1. There is no evidence of CHF nor pneumonia.  2. There is no evidence of a pneumothorax.  3. There may be a trace of fluid in the posterior costophrenic gutter on     the left.     Dictation Site: 2        Verified By: DAVID A. Martinique, M.D., MD    No Known Allergies:   Vital Signs/Nurse's Notes: **Vital Signs.:   09-Apr-14 17:06  Vital Signs Type Admission  Temperature Temperature (F) 98.8  Celsius 37.1  Temperature Source oral  Pulse Pulse 66  Respirations Respirations 18  Systolic BP Systolic BP 381  Diastolic BP (mmHg) Diastolic BP (mmHg) 67  Mean BP 87  Pulse Ox % Pulse Ox % 98  Pulse Ox Activity Level  At rest  Oxygen Delivery Room Air/ 21 %    Impression 64 year old Caucasian male with past medical history significant for history of hyperlipidemia, asthma,   who presented to the hospital with complaints of chest pains.  Cardiology  was consulted for elevated cardiac enz, abn ekg, chest pain consistent with NSTEMI.  1) NSTEMI Case discussed with ER MD and patient.  EKG, labs, and hx reviewed.  New onset chest pain yesterday and last night, sweating overnight.  Elevated cardiac enzymes with troponin of 11 Currently relatively asymptomatic  on heparin, low-dose beta blocker, statin, aspirin Tentatively scheduled for cardiac catheterization tomorrow, fourth case Orders have been placed, n.p.o. in the morning ---Risks and benefits of the procedure were discussed with the patient. Details of the procedure were discussed, including possible findings.  NTG for chest pain -echo to evaluate EF, other pathology -Continue to cycle cardiac enz, add CKMB as he is ruling in.  2)Hyperlipidemia Would check lipids in AM, fasting  3)asthma: continue outpt inhalers   Electronic Signatures: Ida Rogue (MD)  (Signed 09-Apr-14 18:05)  Authored: General Aspect/Present Illness, History and Physical Exam, Review of System, Past Medical History, Health Issues, Home Medications, Labs, EKG , Radiology, Allergies, Vital Signs/Nurse's Notes, Impression/Plan   Last Updated: 09-Apr-14 18:05 by Ida Rogue (MD)

## 2015-06-13 ENCOUNTER — Other Ambulatory Visit: Payer: Self-pay

## 2015-06-13 ENCOUNTER — Encounter: Payer: Self-pay | Admitting: Cardiovascular Disease

## 2015-06-13 MED ORDER — ATORVASTATIN CALCIUM 80 MG PO TABS: 80.0000 mg | ORAL_TABLET | Freq: Every day | ORAL | Status: DC

## 2015-06-13 MED ORDER — LISINOPRIL 5 MG PO TABS: 5.0000 mg | ORAL_TABLET | Freq: Every day | ORAL | Status: DC

## 2015-06-13 MED ORDER — METOPROLOL TARTRATE 25 MG PO TABS: 12.5000 mg | ORAL_TABLET | Freq: Two times a day (BID) | ORAL | Status: DC

## 2015-11-11 ENCOUNTER — Encounter: Payer: Self-pay | Admitting: Cardiovascular Disease

## 2015-11-11 ENCOUNTER — Ambulatory Visit (INDEPENDENT_AMBULATORY_CARE_PROVIDER_SITE_OTHER): Payer: Medicare HMO | Admitting: Cardiovascular Disease

## 2015-11-11 VITALS — BP 120/70 | HR 59 | Ht 68.0 in | Wt 154.0 lb

## 2015-11-11 DIAGNOSIS — I2510 Atherosclerotic heart disease of native coronary artery without angina pectoris: Secondary | ICD-10-CM | POA: Diagnosis not present

## 2015-11-11 DIAGNOSIS — E785 Hyperlipidemia, unspecified: Secondary | ICD-10-CM | POA: Diagnosis not present

## 2015-11-11 DIAGNOSIS — I255 Ischemic cardiomyopathy: Secondary | ICD-10-CM

## 2015-11-11 NOTE — Assessment & Plan Note (Signed)
He is doing extremely well with no anginal symptoms. Continue medical therapy.

## 2015-11-11 NOTE — Patient Instructions (Signed)
Medication Instructions: Continue same medications.   Labwork: None.   Procedures/Testing: None.   Follow-Up: 1 year with Dr. Jazziel Fitzsimmons.   Any Additional Special Instructions Will Be Listed Below (If Applicable).     If you need a refill on your cardiac medications before your next appointment, please call your pharmacy.   

## 2015-11-11 NOTE — Assessment & Plan Note (Signed)
Most recent lipid profile from last month showed an LDL of 82. He is already on maximal dose atorvastatin. He has not been exercising regularly and has not been following healthy diet. I discussed this with him. I think with lifestyle changes he should be able to get his LDL to below 70. Otherwise, Zetia can be considered.

## 2015-11-11 NOTE — Progress Notes (Signed)
HPI   Mr. Gwyn is a 65 yo male who is here today for a followup visit regarding coronary artery disease with previous stenting of the LAD/diagonal. 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 He presented to [facility] on 12/28/12 c/o chest and mid scapular discomfort. He ruled in for NSTEMI and underwent cardiac catheterization revealing 80% prox LAD confirmed by IVUS s/p DES, 90% diagonal s/p DES, 30% prox RCA; EF 40%, mild anterolateral HK and mod apical HK.   Echocardiogram in July of 2014 showed an ejection fraction of 40-45% with mild mitral and aortic regurgitation. Plavix was discontinued in November 2015. He has been doing extremely well and denies any chest pain, shortness of breath or palpitations. He has been taking his medications regularly with no reported side effects.  No Known Allergies   Current Outpatient Prescriptions on File Prior to Visit  Medication Sig Dispense Refill  . albuterol (PROAIR HFA) 108 (90 BASE) MCG/ACT inhaler Inhale 2 puffs into the lungs as needed for wheezing.    Marland Kitchen aspirin 81 MG tablet Take 81 mg by mouth daily.    Marland Kitchen atorvastatin (LIPITOR) 80 MG tablet Take 1 tablet (80 mg total) by mouth daily. 30 tablet 6  . ibuprofen (ADVIL,MOTRIN) 200 MG tablet Take 200 mg by mouth every 4 (four) hours as needed for pain.    . INFLUENZA A, H1N1, MONOVAL PF IM Inject into the muscle.    . lisinopril (PRINIVIL,ZESTRIL) 5 MG tablet Take 1 tablet (5 mg total) by mouth daily. 30 tablet 6  . metoprolol tartrate (LOPRESSOR) 25 MG tablet Take 0.5 tablets (12.5 mg total) by mouth 2 (two) times daily. 30 tablet 6  . nitroGLYCERIN (NITROSTAT) 0.4 MG SL tablet Place 1 tablet (0.4 mg total) under the tongue every 5 (five) minutes as needed for chest pain. 25 tablet 3  . TDaP (ADACEL) 01-20-14.5 LF-MCG/0.5 injection Inject 0.5 mLs into the muscle once.     No current facility-administered medications on file prior to visit.      Past Medical History  Diagnosis Date  . Hyperlipidemia   . Asthma   . Nasal sinus congestion   . Hay fever   . Nasal polyp   . Gilbert's disease     mild  . Neuropathy (HCC)     left arm  . MI (myocardial infarction) (HCC)   . Coronary artery disease     DES-prox LAD & DES-D1     Past Surgical History  Procedure Laterality Date  . Nasal polyp surgery      x2  . Cardiac catheterization  12/29/12    [facility];  80% prox LAD confirmed by IVUS s/p DES, 90% diagonal s/p DES, 30% prox RCA; EF 40%, mild anterolateral HK and mod apical HK     Family History  Problem Relation Age of Onset  . Heart disease Father   . Heart attack Father      Social History   Social History  . Marital Status: Single    Spouse Name: N/A  . Number of Children: N/A  . Years of Education: N/A   Occupational History  . Not on file.   Social History Main Topics  . Smoking status: Never Smoker   . Smokeless tobacco: Not on file  . Alcohol Use: No  . Drug Use: No  . Sexual Activity: Not on file   Other Topics Concern  . Not on file   Social History Narrative  PHYSICAL EXAM   BP 120/70 mmHg  Pulse 59  Ht  (1.727 m)  Wt 154 lb (69.854 kg)  BMI 23.42 kg/m2 Constitutional: He is oriented to person, place, and time. He appears well-developed and well-nourished. No distress.  HENT: No nasal discharge.  Head: Normocephalic and atraumatic.  Eyes: Pupils are equal and round. Right eye exhibits no discharge. Left eye exhibits no discharge.  Neck: Normal range of motion. Neck supple. No JVD present. No thyromegaly present.  Cardiovascular: Normal rate, regular rhythm, normal heart sounds and. Exam reveals no gallop and no friction rub. No murmur heard.  Pulmonary/Chest: Effort normal and breath sounds normal. No stridor. No respiratory distress. He has no wheezes. He has no rales. He exhibits no tenderness.  Abdominal: Soft. Bowel sounds are normal. He exhibits no distension.  There is no tenderness. There is no rebound and no guarding.  Musculoskeletal: Normal range of motion. He exhibits no edema and no tenderness.  Neurological: He is alert and oriented to person, place, and time. Coordination normal.  Skin: Skin is warm and dry. No rash noted. He is not diaphoretic. No erythema. No pallor.  Psychiatric: He has a normal mood and affect. His behavior is normal. Judgment and thought content normal.     EKG: sinus bradycardia with no significant ST or T wave changes.  ASSESSMENT AND PLAN

## 2015-11-11 NOTE — Assessment & Plan Note (Signed)
Most recent ejection fraction was 40%. Continue treatment with metoprolol and lisinopril. He has no symptoms suggestive of heart failure and no evidence of volume overload.

## 2016-01-09 ENCOUNTER — Other Ambulatory Visit: Payer: Self-pay

## 2016-01-09 MED ORDER — ATORVASTATIN CALCIUM 80 MG PO TABS: 80.0000 mg | ORAL_TABLET | Freq: Every day | ORAL | Status: DC

## 2016-01-09 MED ORDER — LISINOPRIL 5 MG PO TABS: 5.0000 mg | ORAL_TABLET | Freq: Every day | ORAL | Status: DC

## 2016-01-09 MED ORDER — METOPROLOL TARTRATE 25 MG PO TABS: 12.5000 mg | ORAL_TABLET | Freq: Two times a day (BID) | ORAL | Status: DC

## 2016-08-10 ENCOUNTER — Encounter: Payer: Self-pay | Admitting: Cardiovascular Disease

## 2016-08-10 ENCOUNTER — Other Ambulatory Visit: Payer: Self-pay | Admitting: *Deleted

## 2016-08-10 MED ORDER — METOPROLOL TARTRATE 25 MG PO TABS: 12.5000 mg | ORAL_TABLET | Freq: Two times a day (BID) | ORAL | 5 refills | Status: DC

## 2016-08-10 MED ORDER — ATORVASTATIN CALCIUM 80 MG PO TABS: 80.0000 mg | ORAL_TABLET | Freq: Every day | ORAL | 5 refills | Status: DC

## 2016-08-10 MED ORDER — LISINOPRIL 5 MG PO TABS: 5.0000 mg | ORAL_TABLET | Freq: Every day | ORAL | 5 refills | Status: DC

## 2016-11-12 ENCOUNTER — Ambulatory Visit (INDEPENDENT_AMBULATORY_CARE_PROVIDER_SITE_OTHER): Payer: Medicare HMO | Admitting: Cardiovascular Disease

## 2016-11-12 ENCOUNTER — Encounter: Payer: Self-pay | Admitting: Cardiovascular Disease

## 2016-11-12 VITALS — BP 100/64 | HR 59 | Ht 68.0 in | Wt 154.8 lb

## 2016-11-12 DIAGNOSIS — I2510 Atherosclerotic heart disease of native coronary artery without angina pectoris: Secondary | ICD-10-CM | POA: Diagnosis not present

## 2016-11-12 DIAGNOSIS — E7800 Pure hypercholesterolemia, unspecified: Secondary | ICD-10-CM

## 2016-11-12 NOTE — Progress Notes (Signed)
Cardiology Office Note   Date:  11/12/2016   ID:  [name] [name], DOB [birth year], MRN [id number]  PCP:  Sharilyn Sites, MD  Cardiologist:   Lorine Bears, MD   Chief Complaint  Patient presents with  . OTHER    1 yr f/u no complaints. Meds reviewed verbally with pt.      History of Present Illness: [name] [name] is a 66 y.o. male who presents for  a followup visit regarding coronary artery disease with previous stenting of the LAD/diagonal. 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 He presented to [facility] on 12/28/12 c/o chest and mid scapular discomfort. He ruled in for NSTEMI and underwent cardiac catheterization revealing 80% prox LAD confirmed by IVUS s/p DES, 90% diagonal s/p DES, 30% prox RCA; EF 40%, mild anterolateral HK and mod apical HK.   Echocardiogram in July of 2014 showed an ejection fraction of 40-45% with mild mitral and aortic regurgitation. Plavix was discontinued in November 2015. He has been doing extremely well and denies any chest pain, shortness of breath or palpitations. He has been taking his medications regularly with no reported side effects.   Past Medical History:  Diagnosis Date  . Asthma   . Coronary artery disease    DES-prox LAD & DES-D1  . Gilbert's disease    mild  . Hay fever   . Hyperlipidemia   . MI (myocardial infarction)   . Nasal polyp   . Nasal sinus congestion   . Neuropathy (HCC)    left arm    Past Surgical History:  Procedure Laterality Date  . CARDIAC CATHETERIZATION  12/29/12   [facility];  80% prox LAD confirmed by IVUS s/p DES, 90% diagonal s/p DES, 30% prox RCA; EF 40%, mild anterolateral HK and mod apical HK  . NASAL POLYP SURGERY     x2     Current Outpatient Prescriptions  Medication Sig Dispense Refill  . ACETAMINOPHEN PO Take by mouth as needed.    Marland Kitchen albuterol (PROAIR HFA) 108 (90 BASE) MCG/ACT inhaler Inhale 2 puffs into the lungs as needed for  wheezing.    Marland Kitchen aspirin 81 MG tablet Take 81 mg by mouth daily.    Marland Kitchen atorvastatin (LIPITOR) 80 MG tablet Take 1 tablet (80 mg total) by mouth daily. 30 tablet 5  . cetirizine (ZYRTEC) 10 MG tablet Take 10 mg by mouth daily.    . fluticasone (FLONASE) 50 MCG/ACT nasal spray Place 1 spray into both nostrils daily as needed for allergies or rhinitis.    . Fluticasone-Salmeterol (ADVAIR) 250-50 MCG/DOSE AEPB Inhale 1 puff into the lungs 2 (two) times daily.    Marland Kitchen ibuprofen (ADVIL,MOTRIN) 200 MG tablet Take 200 mg by mouth every 4 (four) hours as needed for pain.    . INFLUENZA A, H1N1, MONOVAL PF IM Inject into the muscle.    . lisinopril (PRINIVIL,ZESTRIL) 5 MG tablet Take 1 tablet (5 mg total) by mouth daily. 30 tablet 5  . metoprolol tartrate (LOPRESSOR) 25 MG tablet Take 0.5 tablets (12.5 mg total) by mouth 2 (two) times daily. 30 tablet 5  . nitroGLYCERIN (NITROSTAT) 0.4 MG SL tablet Place 1 tablet (0.4 mg total) under the tongue every 5 (five) minutes as needed for chest pain. 25 tablet 3  . TDaP (ADACEL) 01-20-14.5 LF-MCG/0.5 injection Inject 0.5 mLs into the muscle once.     No current facility-administered medications for this visit.     Allergies:   Pollen  extract    Social History:  The patient  reports that he has never smoked. He has never used smokeless tobacco. He reports that he does not drink alcohol or use drugs.   Family History:  The patient's family history includes Heart attack in his father; Heart disease in his father.    ROS:  Please see the history of present illness.   Otherwise, review of systems are positive for none.   All other systems are reviewed and negative.    PHYSICAL EXAM: VS:  BP 100/64 (BP Location: Left Arm, Patient Position: Sitting, Cuff Size: Normal)   Pulse (!) 59   Ht 5\' 8"  (1.727 m)   Wt 154 lb 12 oz (70.2 kg)   BMI 23.53 kg/m  , BMI Body mass index is 23.53 kg/m. GEN: Well nourished, well developed, in no acute distress  HEENT: normal    Neck: no JVD, carotid bruits, or masses Cardiac: RRR; no murmurs, rubs, or gallops,no edema  Respiratory:  clear to auscultation bilaterally, normal work of breathing GI: soft, nontender, nondistended, + BS MS: no deformity or atrophy  Skin: warm and dry, no rash Neuro:  Strength and sensation are intact Psych: euthymic mood, full affect   EKG:  EKG is ordered today. The ekg ordered today demonstrates  normal sinus rhythm with no significant ST or T wave changes.   Recent Labs: No results found for requested labs within last 8760 hours.    Lipid Panel No results found for: CHOL, TRIG, HDL, CHOLHDL, VLDL, LDLCALC, LDLDIRECT    Wt Readings from Last 3 Encounters:  11/12/16 154 lb 12 oz (70.2 kg)  11/11/15 154 lb (69.9 kg)  11/09/14 157 lb (71.2 kg)        No flowsheet data found.    ASSESSMENT AND PLAN:  1.Coronary artery disease involving native coronary arteries without angina: He is doing extremely well. Continue medical therapy.  2. 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 Continue small dose metoprolol and lisinopril. He has no symptoms of heart failure.  3. Hyperlipidemia: Continue high-dose atorvastatin. Most recent lipid profile in January showed an LDL of 65, HDL of 56 and triglyceride of 47.  Disposition:   FU with me in 1 year  Signed,  Lorine BearsMuhammad Layni Kreamer, MD  11/12/2016 12:19 PM    [facility]

## 2016-11-12 NOTE — Patient Instructions (Signed)
Medication Instructions: Continue same medications.   Labwork: None.   Procedures/Testing: None.   Follow-Up: 1 year with Dr. Ismahan Lippman.   Any Additional Special Instructions Will Be Listed Below (If Applicable).     If you need a refill on your cardiac medications before your next appointment, please call your pharmacy.   

## 2016-12-04 ENCOUNTER — Other Ambulatory Visit: Payer: Self-pay

## 2016-12-04 MED ORDER — LISINOPRIL 5 MG PO TABS: 5.0000 mg | ORAL_TABLET | Freq: Every day | ORAL | 5 refills | Status: DC

## 2017-02-04 ENCOUNTER — Encounter: Payer: Self-pay | Admitting: Cardiovascular Disease

## 2017-02-04 ENCOUNTER — Other Ambulatory Visit: Payer: Self-pay | Admitting: *Deleted

## 2017-02-04 MED ORDER — METOPROLOL TARTRATE 25 MG PO TABS: 12.5000 mg | ORAL_TABLET | Freq: Two times a day (BID) | ORAL | 3 refills | Status: DC

## 2017-02-04 MED ORDER — ATORVASTATIN CALCIUM 80 MG PO TABS: 80.0000 mg | ORAL_TABLET | Freq: Every day | ORAL | 3 refills | Status: DC

## 2017-02-04 MED ORDER — LISINOPRIL 5 MG PO TABS: 5.0000 mg | ORAL_TABLET | Freq: Every day | ORAL | 3 refills | Status: DC

## 2017-09-21 HISTORY — PX: COLONOSCOPY: SHX174

## 2017-09-21 HISTORY — PX: PROSTATE BIOPSY: SHX241

## 2017-11-12 ENCOUNTER — Ambulatory Visit: Payer: Medicare HMO | Admitting: Cardiovascular Disease

## 2017-11-12 ENCOUNTER — Encounter: Payer: Self-pay | Admitting: Cardiovascular Disease

## 2017-11-12 VITALS — BP 100/60 | HR 67 | Ht 68.0 in | Wt 154.0 lb

## 2017-11-12 DIAGNOSIS — I255 Ischemic cardiomyopathy: Secondary | ICD-10-CM

## 2017-11-12 DIAGNOSIS — E7800 Pure hypercholesterolemia, unspecified: Secondary | ICD-10-CM | POA: Diagnosis not present

## 2017-11-12 DIAGNOSIS — I2510 Atherosclerotic heart disease of native coronary artery without angina pectoris: Secondary | ICD-10-CM

## 2017-11-12 NOTE — Progress Notes (Signed)
Cardiology Office Note   Date:  11/12/2017   ID:  [name] [name], DOB 10/22/1950, MRN [id number]  PCP:  Sharilyn Sites, MD  Cardiologist:   Lorine Bears, MD   Chief Complaint  Patient presents with  . other    12 month follow up. Meds reviewed by the pt. verbally. "doing well."       History of Present Illness: [name] [name] is a 67 y.o. male who presents for  a followup visit regarding coronary artery disease with previous stenting of the LAD/diagonal. 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 He presented to [facility] on 12/28/12 c/o chest and mid scapular discomfort. He ruled in for NSTEMI and underwent cardiac catheterization revealing 80% prox LAD confirmed by IVUS s/p DES, 90% diagonal s/p DES, 30% prox RCA; EF 40%, mild anterolateral HK and mod apical HK.   Echocardiogram in July of 2014 showed an ejection fraction of 40-45% with mild mitral and aortic regurgitation. Plavix was discontinued in November 2015.  He is here for an annual follow-up and overall has been doing extremely well.  No chest pain, shortness of breath or palpitations.  He was found to have elevated PSA recently and this is being followed closely by urology. He reports no side effects with medications.  Past Medical History:  Diagnosis Date  . Asthma   . Coronary artery disease    DES-prox LAD & DES-D1  . Gilbert's disease    mild  . Hay fever   . Hyperlipidemia   . MI (myocardial infarction) (HCC)   . Nasal polyp   . Nasal sinus congestion   . Neuropathy    left arm    Past Surgical History:  Procedure Laterality Date  . CARDIAC CATHETERIZATION  12/29/12   [facility];  80% prox LAD confirmed by IVUS s/p DES, 90% diagonal s/p DES, 30% prox RCA; EF 40%, mild anterolateral HK and mod apical HK  . NASAL POLYP SURGERY     x2     Current Outpatient Medications  Medication Sig Dispense Refill  . ACETAMINOPHEN PO Take by mouth as  needed.    Marland Kitchen albuterol (PROAIR HFA) 108 (90 BASE) MCG/ACT inhaler Inhale 2 puffs into the lungs as needed for wheezing.    Marland Kitchen aspirin 81 MG tablet Take 81 mg by mouth daily.    Marland Kitchen atorvastatin (LIPITOR) 80 MG tablet Take 1 tablet (80 mg total) by mouth daily. 90 tablet 3  . cetirizine (ZYRTEC) 10 MG tablet Take 10 mg by mouth daily.    . fluticasone (FLONASE) 50 MCG/ACT nasal spray Place 1 spray into both nostrils daily as needed for allergies or rhinitis.    . Fluticasone-Salmeterol (ADVAIR) 250-50 MCG/DOSE AEPB Inhale 1 puff into the lungs 2 (two) times daily.    Marland Kitchen ibuprofen (ADVIL,MOTRIN) 200 MG tablet Take 200 mg by mouth every 4 (four) hours as needed for pain.    . INFLUENZA A, H1N1, MONOVAL PF IM Inject into the muscle.    . lisinopril (PRINIVIL,ZESTRIL) 5 MG tablet Take 1 tablet (5 mg total) by mouth daily. 90 tablet 3  . metoprolol tartrate (LOPRESSOR) 25 MG tablet Take 0.5 tablets (12.5 mg total) by mouth 2 (two) times daily. 90 tablet 3  . nitroGLYCERIN (NITROSTAT) 0.4 MG SL tablet Place 1 tablet (0.4 mg total) under the tongue every 5 (five) minutes as needed for chest pain. 25 tablet 3  . TDaP (ADACEL) 01-20-14.5 LF-MCG/0.5 injection Inject 0.5 mLs into  the muscle once.     No current facility-administered medications for this visit.     Allergies:   Pollen extract    Social History:  The patient  reports that  has never smoked. he has never used smokeless tobacco. He reports that he does not drink alcohol or use drugs.   Family History:  The patient's family history includes Heart attack in his father; Heart disease in his father.    ROS:  Please see the history of present illness.   Otherwise, review of systems are positive for none.   All other systems are reviewed and negative.    PHYSICAL EXAM: VS:  BP 100/60 (BP Location: Left Arm, Patient Position: Sitting, Cuff Size: Normal)   Pulse 67   Ht 5\' 8"  (1.727 m)   Wt 154 lb (69.9 kg)   BMI 23.42 kg/m  , BMI Body mass  index is 23.42 kg/m. GEN: Well nourished, well developed, in no acute distress  HEENT: normal  Neck: no JVD, carotid bruits, or masses Cardiac: RRR; no murmurs, rubs, or gallops,no edema  Respiratory:  clear to auscultation bilaterally, normal work of breathing GI: soft, nontender, nondistended, + BS MS: no deformity or atrophy  Skin: warm and dry, no rash Neuro:  Strength and sensation are intact Psych: euthymic mood, full affect   EKG:  EKG is ordered today. The ekg ordered today demonstrates normal sinus rhythm with nonspecific ST changes.   Recent Labs: No results found for requested labs within last 8760 hours.    Lipid Panel No results found for: CHOL, TRIG, HDL, CHOLHDL, VLDL, LDLCALC, LDLDIRECT    Wt Readings from Last 3 Encounters:  11/12/17 154 lb (69.9 kg)  11/12/16 154 lb 12 oz (70.2 kg)  11/11/15 154 lb (69.9 kg)        No flowsheet data found.    ASSESSMENT AND PLAN:  1.Coronary artery disease involving native coronary arteries without angina: He is doing extremely well. Continue medical therapy. If the patient needs to have prostate biopsy or colonoscopy, he is considered at low risk from a cardiac standpoint.  Low-dose aspirin should be continued if at all possible given previous drug-eluting stents and this the risk of bleeding is too high.  2. 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 Continue small dose metoprolol and lisinopril. He has no symptoms of heart failure.  I reviewed recent labs done with his primary care physician last month which showed normal renal function.  3. Hyperlipidemia: Continue high-dose atorvastatin.   Most recent lipid profile in January of this year showed a total cholesterol of 122, triglyceride of 45, HDL of 49 and an LDL of 64.   Disposition:   FU with me in 1 year  Signed,  Lorine BearsMuhammad Vernell Back, MD  11/12/2017 10:40 AM    [facility]

## 2017-11-12 NOTE — Patient Instructions (Signed)
Medication Instructions: Continue same medications.   Labwork: None.   Procedures/Testing: None.   Follow-Up: 1 year with Dr. Lyn Deemer.   Any Additional Special Instructions Will Be Listed Below (If Applicable).     If you need a refill on your cardiac medications before your next appointment, please call your pharmacy.   

## 2018-02-01 ENCOUNTER — Encounter: Payer: Self-pay | Admitting: Cardiovascular Disease

## 2018-02-01 ENCOUNTER — Other Ambulatory Visit: Payer: Self-pay | Admitting: *Deleted

## 2018-02-01 MED ORDER — METOPROLOL TARTRATE 25 MG PO TABS: 12.5000 mg | ORAL_TABLET | Freq: Two times a day (BID) | ORAL | 3 refills | Status: DC

## 2018-02-01 MED ORDER — ATORVASTATIN CALCIUM 80 MG PO TABS: 80.0000 mg | ORAL_TABLET | Freq: Every day | ORAL | 3 refills | Status: DC

## 2018-02-01 MED ORDER — LISINOPRIL 5 MG PO TABS: 5.0000 mg | ORAL_TABLET | Freq: Every day | ORAL | 3 refills | Status: DC

## 2018-02-01 NOTE — Telephone Encounter (Signed)
Requested Prescriptions   Signed Prescriptions Disp Refills  . lisinopril (PRINIVIL,ZESTRIL) 5 MG tablet 90 tablet 3    Sig: Take 1 tablet (5 mg total) by mouth daily.    Authorizing Provider: Lorine Bears A    Ordering User: Shawnie Dapper, MARINA C  . atorvastatin (LIPITOR) 80 MG tablet 90 tablet 3    Sig: Take 1 tablet (80 mg total) by mouth daily.    Authorizing Provider: Lorine Bears A    Ordering User: Shawnie Dapper, MARINA C  . metoprolol tartrate (LOPRESSOR) 25 MG tablet 90 tablet 3    Sig: Take 0.5 tablets (12.5 mg total) by mouth 2 (two) times daily.    Authorizing Provider: Lorine Bears A    Ordering User: Kendrick Fries

## 2018-02-01 NOTE — Telephone Encounter (Signed)
Good Afternoon,  Your refills have been sent to your local pharmacy.  Thank you, Sohum Delillo,CMA     Requested Prescriptions   Signed Prescriptions Disp Refills  . lisinopril (PRINIVIL,ZESTRIL) 5 MG tablet 90 tablet 3    Sig: Take 1 tablet (5 mg total) by mouth daily.    Authorizing Provider: Lorine Bears A    Ordering User: Shawnie Dapper, Dwyane Dupree C  . atorvastatin (LIPITOR) 80 MG tablet 90 tablet 3    Sig: Take 1 tablet (80 mg total) by mouth daily.    Authorizing Provider: Lorine Bears A    Ordering User: Shawnie Dapper, Liyana Suniga C  . metoprolol tartrate (LOPRESSOR) 25 MG tablet 90 tablet 3    Sig: Take 0.5 tablets (12.5 mg total) by mouth 2 (two) times daily.    Authorizing Provider: Lorine Bears A    Ordering User: Kendrick Fries

## 2018-11-17 ENCOUNTER — Ambulatory Visit: Payer: Medicare HMO | Admitting: Cardiovascular Disease

## 2018-11-17 ENCOUNTER — Encounter: Payer: Self-pay | Admitting: Cardiovascular Disease

## 2018-11-17 VITALS — BP 110/62 | HR 67 | Ht 68.0 in | Wt 149.2 lb

## 2018-11-17 DIAGNOSIS — I493 Ventricular premature depolarization: Secondary | ICD-10-CM

## 2018-11-17 DIAGNOSIS — E785 Hyperlipidemia, unspecified: Secondary | ICD-10-CM

## 2018-11-17 DIAGNOSIS — I2510 Atherosclerotic heart disease of native coronary artery without angina pectoris: Secondary | ICD-10-CM

## 2018-11-17 DIAGNOSIS — I255 Ischemic cardiomyopathy: Secondary | ICD-10-CM | POA: Diagnosis not present

## 2018-11-17 NOTE — Progress Notes (Signed)
Cardiology Office Note   Date:  11/17/2018   ID:  [name] [name], DOB Jan 17, 1951, MRN [id number]  PCP:  Sharilyn Sites, MD  Cardiologist:   Lorine Bears, MD   Chief Complaint  Patient presents with  . other    12 month follow up. Meds reviewed by the pt. verbally. "doing well."       History of Present Illness: [name] [name] is a 68 y.o. male who presents for  a followup visit regarding coronary artery disease with previous stenting of the LAD/diagonal. 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 He presented to [facility] on 12/28/12 with NSTEMI .  Cardiac catheterization showed 80% prox LAD confirmed by IVUS s/p DES, 90% diagonal s/p DES, 30% prox RCA; EF 40%, mild anterolateral HK and mod apical HK.   Echocardiogram in July of 2014 showed an ejection fraction of 40-45% with mild mitral and aortic regurgitation. Plavix was discontinued in November 2015.  He has been doing well with no recent chest pain, shortness of breath or palpitations.  He is noted to have frequent PVCs on his EKG today and by physical exam.  No dizziness, syncope or presyncope. He started walking recently for exercise and has been doing well.  Past Medical History:  Diagnosis Date  . Asthma   . Coronary artery disease    DES-prox LAD & DES-D1  . Gilbert's disease    mild  . Hay fever   . Hyperlipidemia   . MI (myocardial infarction) (HCC)   . Nasal polyp   . Nasal sinus congestion   . Neuropathy    left arm    Past Surgical History:  Procedure Laterality Date  . CARDIAC CATHETERIZATION  12/29/12   [facility];  80% prox LAD confirmed by IVUS s/p DES, 90% diagonal s/p DES, 30% prox RCA; EF 40%, mild anterolateral HK and mod apical HK  . COLONOSCOPY  2019   6 polyps removed   . NASAL POLYP SURGERY     x2  . PROSTATE BIOPSY  2019     Current Outpatient Medications  Medication Sig Dispense Refill  . ACETAMINOPHEN PO Take by mouth as  needed.    Marland Kitchen albuterol (PROAIR HFA) 108 (90 BASE) MCG/ACT inhaler Inhale 2 puffs into the lungs as needed for wheezing.    Marland Kitchen aspirin 81 MG tablet Take 81 mg by mouth daily.    Marland Kitchen atorvastatin (LIPITOR) 80 MG tablet Take 1 tablet (80 mg total) by mouth daily. 90 tablet 3  . cetirizine (ZYRTEC) 10 MG tablet Take 10 mg by mouth daily.    . fluticasone (FLONASE) 50 MCG/ACT nasal spray Place 1 spray into both nostrils daily as needed for allergies or rhinitis.    . Fluticasone-Salmeterol (ADVAIR) 250-50 MCG/DOSE AEPB Inhale 1 puff into the lungs 2 (two) times daily.    Marland Kitchen ibuprofen (ADVIL,MOTRIN) 200 MG tablet Take 200 mg by mouth every 4 (four) hours as needed for pain.    . INFLUENZA A, H1N1, MONOVAL PF IM Inject into the muscle.    . lisinopril (PRINIVIL,ZESTRIL) 5 MG tablet Take 1 tablet (5 mg total) by mouth daily. 90 tablet 3  . metoprolol tartrate (LOPRESSOR) 25 MG tablet Take 0.5 tablets (12.5 mg total) by mouth 2 (two) times daily. 90 tablet 3  . nitroGLYCERIN (NITROSTAT) 0.4 MG SL tablet Place 1 tablet (0.4 mg total) under the tongue every 5 (five) minutes as needed for chest pain. 25 tablet 3  .  TDaP (ADACEL) 01-20-14.5 LF-MCG/0.5 injection Inject 0.5 mLs into the muscle once.     No current facility-administered medications for this visit.     Allergies:   Pollen extract    Social History:  The patient  reports that he has never smoked. He has never used smokeless tobacco. He reports that he does not drink alcohol or use drugs.   Family History:  The patient's family history includes Heart attack in his father; Heart disease in his father.    ROS:  Please see the history of present illness.   Otherwise, review of systems are positive for none.   All other systems are reviewed and negative.    PHYSICAL EXAM: VS:  BP 110/62 (BP Location: Left Arm, Patient Position: Sitting, Cuff Size: Normal)   Pulse 67   Ht 5\' 8"  (1.727 m)   Wt 149 lb 4 oz (67.7 kg)   BMI 22.69 kg/m  , BMI Body  mass index is 22.69 kg/m. GEN: Well nourished, well developed, in no acute distress  HEENT: normal  Neck: no JVD, carotid bruits, or masses Cardiac: RRR; no murmurs, rubs, or gallops,no edema  Respiratory:  clear to auscultation bilaterally, normal work of breathing GI: soft, nontender, nondistended, + BS MS: no deformity or atrophy  Skin: warm and dry, no rash Neuro:  Strength and sensation are intact Psych: euthymic mood, full affect   EKG:  EKG is ordered today. The ekg ordered today demonstrates normal sinus rhythm with PVCs.   Recent Labs: No results found for requested labs within last 8760 hours.    Lipid Panel No results found for: CHOL, TRIG, HDL, CHOLHDL, VLDL, LDLCALC, LDLDIRECT    Wt Readings from Last 3 Encounters:  11/17/18 149 lb 4 oz (67.7 kg)  11/12/17 154 lb (69.9 kg)  11/12/16 154 lb 12 oz (70.2 kg)        No flowsheet data found.    ASSESSMENT AND PLAN:  1.Coronary artery disease involving native coronary arteries without angina: He is doing extremely well. Continue medical therapy.  2. 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 Continue small dose metoprolol and lisinopril.  Given PVCs noted on today's EKG, I am going to repeat his echocardiogram to ensure stability of his ejection fraction.  3. Hyperlipidemia: Continue high-dose atorvastatin.   I reviewed most recent lipid profile in January which showed an LDL of 64 and triglyceride of 41.  4.  PVCs: Noted on EKG and also by physical exam.  He does not appear to be symptomatic. I am obtaining an echocardiogram as noted above.   Disposition:   FU with me in 1 year  Signed,  Lorine Bears, MD  11/17/2018 10:15 AM    [facility]

## 2018-11-17 NOTE — Patient Instructions (Signed)
Medication Instructions:  No changes If you need a refill on your cardiac medications before your next appointment, please call your pharmacy.   Lab work: None ordered  Testing/Procedures: Your physician has requested that you have an echocardiogram. Echocardiography is a painless test that uses sound waves to create images of your heart. It provides your doctor with information about the size and shape of your heart and how well your heart's chambers and valves are working. You may receive an ultrasound enhancing agent through an IV if needed to better visualize your heart during the echo.This procedure takes approximately one hour. There are no restrictions for this procedure. This will take place at the [facility] clinic.    Follow-Up: At [facility], you and your health needs are our priority.  As part of our continuing mission to provide you with exceptional heart care, we have created designated Provider Care Teams.  These Care Teams include your primary Cardiologist (physician) and Advanced Practice Providers (APPs -  Physician Assistants and Nurse Practitioners) who all work together to provide you with the care you need, when you need it. You will need a follow up appointment in 12 months.  Please call our office 2 months in advance to schedule this appointment.  You may see Dr. Arida or one of the following Advanced Practice Providers on your designated Care Team:   Christopher Berge, NP Ryan Dunn, PA-C . Jacquelyn Visser, PA-C    

## 2018-12-15 ENCOUNTER — Other Ambulatory Visit: Payer: Self-pay

## 2018-12-15 ENCOUNTER — Ambulatory Visit (INDEPENDENT_AMBULATORY_CARE_PROVIDER_SITE_OTHER): Payer: Medicare HMO

## 2018-12-15 DIAGNOSIS — I255 Ischemic cardiomyopathy: Secondary | ICD-10-CM

## 2018-12-15 MED ORDER — PERFLUTREN LIPID MICROSPHERE
1.0000 mL | INTRAVENOUS | Status: AC | PRN
  Administered 2018-12-15: 2 mL via INTRAVENOUS

## 2019-01-25 MED ORDER — LISINOPRIL 5 MG PO TABS: 5.0000 mg | ORAL_TABLET | Freq: Every day | ORAL | 3 refills | Status: DC

## 2019-01-25 MED ORDER — METOPROLOL TARTRATE 25 MG PO TABS: 12.5000 mg | ORAL_TABLET | Freq: Two times a day (BID) | ORAL | 3 refills | Status: DC

## 2019-01-25 MED ORDER — ATORVASTATIN CALCIUM 80 MG PO TABS: 80.0000 mg | ORAL_TABLET | Freq: Every day | ORAL | 3 refills | Status: DC

## 2019-11-16 ENCOUNTER — Other Ambulatory Visit: Payer: Self-pay

## 2019-11-16 ENCOUNTER — Ambulatory Visit: Payer: Medicare HMO | Admitting: Cardiovascular Disease

## 2019-11-16 ENCOUNTER — Other Ambulatory Visit (HOSPITAL_COMMUNITY): Payer: Self-pay

## 2019-11-16 ENCOUNTER — Encounter: Payer: Self-pay | Admitting: Cardiovascular Disease

## 2019-11-16 VITALS — BP 120/80 | HR 57 | Ht 68.0 in | Wt 150.5 lb

## 2019-11-16 DIAGNOSIS — E7800 Pure hypercholesterolemia, unspecified: Secondary | ICD-10-CM | POA: Diagnosis not present

## 2019-11-16 DIAGNOSIS — I255 Ischemic cardiomyopathy: Secondary | ICD-10-CM | POA: Diagnosis not present

## 2019-11-16 DIAGNOSIS — I2510 Atherosclerotic heart disease of native coronary artery without angina pectoris: Secondary | ICD-10-CM

## 2019-11-16 NOTE — Patient Instructions (Signed)
Medication Instructions:  Your physician recommends that you continue on your current medications as directed. Please refer to the Current Medication list given to you today.  *If you need a refill on your cardiac medications before your next appointment, please call your pharmacy*  Lab Work: None ordered If you have labs (blood work) drawn today and your tests are completely normal, you will receive your results only by: . MyChart Message (if you have MyChart) OR . A paper copy in the mail If you have any lab test that is abnormal or we need to change your treatment, we will call you to review the results.  Testing/Procedures: None ordered  Follow-Up: At [facility], you and your health needs are our priority.  As part of our continuing mission to provide you with exceptional heart care, we have created designated Provider Care Teams.  These Care Teams include your primary Cardiologist (physician) and Advanced Practice Providers (APPs -  Physician Assistants and Nurse Practitioners) who all work together to provide you with the care you need, when you need it.  Your next appointment:   12 months  The format for your next appointment:   In Person  Provider:    You may see Dr. Arida or one of the following Advanced Practice Providers on your designated Care Team:    Christopher Berge, NP  Ryan Dunn, PA-C  Jacquelyn Visser, PA-C   Other Instructions N/A  

## 2019-11-16 NOTE — Progress Notes (Signed)
Cardiology Office Note   Date:  11/16/2019   ID:  [name] [name], DOB Mar 05, 1951, MRN [id number]  PCP:  Marygrace Drought, MD  Cardiologist:   Kathlyn Sacramento, MD   Chief Complaint  Patient presents with  . other    12 month follow up. Meds reviewed by the pt. verbally. "doing well."       History of Present Illness: [name] [name] is a 69 y.o. male who presents for a followup visit regarding coronary artery disease with previous stenting of the LAD/diagonal. 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 He presented to [facility] on 12/[facility]/12 with NSTEMI .  Cardiac catheterization showed 80% prox LAD confirmed by IVUS s/p DES, 90% diagonal s/p DES, 30% prox RCA; EF 40%, mild anterolateral HK and mod apical HK.   Echocardiogram in July of 2014 showed an ejection fraction of 40-45% with mild mitral and aortic regurgitation. He was noted to have PVCs last year but he was overall asymptomatic.  I repeated his echocardiogram which showed stable ejection fraction of 40 to 45% with mild to moderate mitral regurgitation. He is doing extremely well and denies any chest pain, shortness of breath or palpitations.  He is compliant with all his medications.   Past Medical History:  Diagnosis Date  . Asthma   . Coronary artery disease    DES-prox LAD & DES-D1  . Gilbert's disease    mild  . Hay fever   . Hyperlipidemia   . MI (myocardial infarction) ([place])   . Nasal polyp   . Nasal sinus congestion   . Neuropathy    left arm    Past Surgical History:  Procedure Laterality Date  . CARDIAC CATHETERIZATION  12/29/12   [facility];  80% prox LAD confirmed by IVUS s/p DES, 90% diagonal s/p DES, 30% prox RCA; EF 40%, mild anterolateral HK and mod apical HK  . COLONOSCOPY  2019   6 polyps removed   . NASAL POLYP SURGERY     x2  . PROSTATE BIOPSY  2019     Current Outpatient Medications  Medication Sig Dispense Refill  . ACETAMINOPHEN  PO Take by mouth as needed.    Marland Kitchen albuterol (PROAIR HFA) 108 (90 BASE) MCG/ACT inhaler Inhale 2 puffs into the lungs as needed for wheezing.    Marland Kitchen aspirin 81 MG tablet Take 81 mg by mouth daily.    Marland Kitchen atorvastatin (LIPITOR) 80 MG tablet Take 1 tablet (80 mg total) by mouth daily. 90 tablet 3  . cetirizine (ZYRTEC) 10 MG tablet Take 10 mg by mouth daily.    . fluticasone (FLONASE) 50 MCG/ACT nasal spray Place 1 spray into both nostrils daily as needed for allergies or rhinitis.    . Fluticasone-Salmeterol (ADVAIR) 250-50 MCG/DOSE AEPB Inhale 1 puff into the lungs 2 (two) times daily.    Marland Kitchen ibuprofen (ADVIL,MOTRIN) 200 MG tablet Take 200 mg by mouth every 4 (four) hours as needed for pain.    . INFLUENZA A, H1N1, MONOVAL PF IM Inject into the muscle.    . lisinopril (ZESTRIL) 5 MG tablet Take 1 tablet (5 mg total) by mouth daily. 90 tablet 3  . metoprolol tartrate (LOPRESSOR) 25 MG tablet Take 0.5 tablets (12.5 mg total) by mouth 2 (two) times daily. 90 tablet 3  . nitroGLYCERIN (NITROSTAT) 0.4 MG SL tablet Place 1 tablet (0.4 mg total) under the tongue every 5 (five) minutes as needed for chest pain. 25 tablet 3  .  TDaP (ADACEL) 01-20-14.5 LF-MCG/0.5 injection Inject 0.5 mLs into the muscle once.     No current facility-administered medications for this visit.    Allergies:   Pollen extract    Social History:  The patient  reports that he has never smoked. He has never used smokeless tobacco. He reports that he does not drink alcohol or use drugs.   Family History:  The patient's family history includes Heart attack in his father; Heart disease in his father.    ROS:  Please see the history of present illness.   Otherwise, review of systems are positive for none.   All other systems are reviewed and negative.    PHYSICAL EXAM: VS:  BP 120/80 (BP Location: Left Arm, Patient Position: Sitting, Cuff Size: Normal)   Pulse (!) 57   Ht 5\' 8"  (1.727 m)   Wt 150 lb 8 oz (68.3 kg)   SpO2 96%   BMI  22.88 kg/m  , BMI Body mass index is 22.88 kg/m. GEN: Well nourished, well developed, in no acute distress  HEENT: normal  Neck: no JVD, carotid bruits, or masses Cardiac: RRR; no murmurs, rubs, or gallops,no edema  Respiratory:  clear to auscultation bilaterally, normal work of breathing GI: soft, nontender, nondistended, + BS MS: no deformity or atrophy  Skin: warm and dry, no rash Neuro:  Strength and sensation are intact Psych: euthymic mood, full affect   EKG:  EKG is ordered today. The ekg ordered today sinus bradycardia with no significant ST or T wave changes.   Recent Labs: No results found for requested labs within last 8760 hours.    Lipid Panel No results found for: CHOL, TRIG, HDL, CHOLHDL, VLDL, LDLCALC, LDLDIRECT    Wt Readings from Last 3 Encounters:  11/16/19 150 lb 8 oz (68.3 kg)  11/17/18 149 lb 4 oz (67.7 kg)  11/12/17 154 lb (69.9 kg)        No flowsheet data found.    ASSESSMENT AND PLAN:  1.Coronary artery disease involving native coronary arteries without angina: He is doing extremely well. Continue medical therapy.  2. 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 Continue small dose metoprolol and lisinopril.  No evidence of volume overload.  3. Hyperlipidemia: Continue high-dose atorvastatin.   I reviewed most recent lipid profile in 2020 which showed an LDL of 64 and triglyceride of 41.  His LDL has been consistently below 70  4.  PVCs these were noted last year but they are not evident today.   Disposition:   FU with me in 1 year  Signed,  2021, MD  11/16/2019 10:00 AM    [facility]

## 2020-01-17 ENCOUNTER — Other Ambulatory Visit: Payer: Self-pay

## 2020-01-17 MED ORDER — METOPROLOL TARTRATE 25 MG PO TABS: 12.5000 mg | ORAL_TABLET | Freq: Two times a day (BID) | ORAL | 2 refills | Status: DC

## 2020-01-17 MED ORDER — LISINOPRIL 5 MG PO TABS: 5.0000 mg | ORAL_TABLET | Freq: Every day | ORAL | 2 refills | Status: DC

## 2020-01-17 MED ORDER — ATORVASTATIN CALCIUM 80 MG PO TABS: 80.0000 mg | ORAL_TABLET | Freq: Every day | ORAL | 2 refills | Status: DC

## 2020-10-14 MED ORDER — ATORVASTATIN CALCIUM 80 MG PO TABS: 80.0000 mg | ORAL_TABLET | Freq: Every day | ORAL | 2 refills | Status: DC

## 2020-10-14 MED ORDER — LISINOPRIL 5 MG PO TABS: 5.0000 mg | ORAL_TABLET | Freq: Every day | ORAL | 2 refills | Status: DC

## 2020-10-14 MED ORDER — METOPROLOL TARTRATE 25 MG PO TABS: 12.5000 mg | ORAL_TABLET | Freq: Two times a day (BID) | ORAL | 2 refills | Status: DC

## 2020-11-10 ENCOUNTER — Emergency Department: Payer: Medicare HMO

## 2020-11-10 ENCOUNTER — Other Ambulatory Visit: Payer: Self-pay

## 2020-11-10 ENCOUNTER — Emergency Department
Admission: EM | Admit: 2020-11-10 | Discharge: 2020-11-10 | Disposition: A | Discharge status: Home / Self Care | Payer: Medicare HMO | Attending: Emergency Medicine | Admitting: Emergency Medicine

## 2020-11-10 DIAGNOSIS — M7918 Myalgia, other site: Secondary | ICD-10-CM

## 2020-11-10 DIAGNOSIS — M791 Myalgia, unspecified site: Secondary | ICD-10-CM | POA: Diagnosis not present

## 2020-11-10 DIAGNOSIS — I2510 Atherosclerotic heart disease of native coronary artery without angina pectoris: Secondary | ICD-10-CM | POA: Diagnosis not present

## 2020-11-10 DIAGNOSIS — R109 Unspecified abdominal pain: Secondary | ICD-10-CM | POA: Insufficient documentation

## 2020-11-10 DIAGNOSIS — Z7982 Long term (current) use of aspirin: Secondary | ICD-10-CM | POA: Diagnosis not present

## 2020-11-10 DIAGNOSIS — J45909 Unspecified asthma, uncomplicated: Secondary | ICD-10-CM | POA: Insufficient documentation

## 2020-11-10 LAB — URINALYSIS, COMPLETE (UACMP) WITH MICROSCOPIC
Bacteria, UA: NONE SEEN
Bilirubin Urine: NEGATIVE
Glucose, UA: NEGATIVE mg/dL
Hgb urine dipstick: NEGATIVE
Ketones, ur: NEGATIVE mg/dL
Leukocytes,Ua: NEGATIVE
Nitrite: NEGATIVE
Protein, ur: NEGATIVE mg/dL
Specific Gravity, Urine: 1.019 (ref 1.005–1.030)
Squamous Epithelial / HPF: NONE SEEN (ref 0–5)
pH: 5 (ref 5.0–8.0)

## 2020-11-10 LAB — BASIC METABOLIC PANEL
Anion gap: 8 (ref 5–15)
BUN: 16 mg/dL (ref 8–23)
CO2: 25 mmol/L (ref 22–32)
Calcium: 9.1 mg/dL (ref 8.9–10.3)
Chloride: 104 mmol/L (ref 98–111)
Creatinine, Ser: 0.82 mg/dL (ref 0.61–1.24)
GFR, Estimated: >60 mL/min (ref >60)
Glucose, Bld: 96 mg/dL (ref 70–99)
Potassium: 4.1 mmol/L (ref 3.5–5.1)
Sodium: 137 mmol/L (ref 135–145)

## 2020-11-10 LAB — CBC
HCT: 42.8 % (ref 39.0–52.0)
Hemoglobin: 14.6 g/dL (ref 13.0–17.0)
MCH: 32.3 pg (ref 26.0–34.0)
MCHC: 34.1 g/dL (ref 30.0–36.0)
MCV: 94.7 fL (ref 80.0–100.0)
Platelets: 157 10*3/uL (ref 150–400)
RBC: 4.52 MIL/uL (ref 4.22–5.81)
RDW: 13 % (ref 11.5–15.5)
WBC: 6.6 10*3/uL (ref 4.0–10.5)
nRBC: 0 % (ref 0.0–0.2)

## 2020-11-10 MED ORDER — LIDOCAINE 5 % EX PTCH
1.0000 | MEDICATED_PATCH | CUTANEOUS | Status: DC
  Administered 2020-11-10: 1 via TRANSDERMAL
  Filled 2020-11-10: qty 1

## 2020-11-10 MED ORDER — LIDOCAINE 5 % EX PTCH: 1.0000 | MEDICATED_PATCH | Freq: Two times a day (BID) | CUTANEOUS | 0 refills | Status: DC

## 2020-11-10 NOTE — ED Provider Notes (Signed)
[facility] Emergency Department Provider Note  ____________________________________________   Event Date/Time   First MD Initiated Contact with Patient 11/10/20 [phone]     (approximate)  I have reviewed the triage vital signs and the nursing notes.   HISTORY  Chief Complaint Flank Pain    HPI [name] [name] is a 70 y.o. male with medical history as listed below who presents for evaluation of right flank pain. Is been going on for a couple of days and is constant. No radiation, no abdominal pain, no dysuria nor hematuria. No recent trauma. No history of similar symptoms. The patient cannot tell anything in particular that makes the symptoms better or worse although the right flank is tender to palpation.         Past Medical History:  Diagnosis Date  . Asthma   . Coronary artery disease    DES-prox LAD & DES-D1  . Gilbert's disease    mild  . Hay fever   . Hyperlipidemia   . MI (myocardial infarction) ([facility])   . Nasal polyp   . Nasal sinus congestion   . Neuropathy    left arm    Patient Active Problem List   Diagnosis Date Noted  . CAD (coronary artery disease), native coronary artery 01/11/2013  . Hyperlipidemia 01/11/2013  . Cardiomyopathy, ischemic 01/11/2013  . Asthma 01/11/2013    Past Surgical History:  Procedure Laterality Date  . CARDIAC CATHETERIZATION  12/29/12   [facility];  80% prox LAD confirmed by IVUS s/p DES, 90% diagonal s/p DES, 30% prox RCA; EF 40%, mild anterolateral HK and mod apical HK  . COLONOSCOPY  2019   6 polyps removed   . NASAL POLYP SURGERY     x2  . PROSTATE BIOPSY  2019    Prior to Admission medications   Medication Sig Start Date End Date Taking? Authorizing Provider  lidocaine (LIDODERM) 5 % Place 1 patch onto the skin every 12 (twelve) hours. Remove & Discard patch within 12 hours or as directed by MD.  Wynelle FannyLeave the patch off for 12 hours before applying a new one. 11/10/20 11/10/21 Yes Loleta RoseForbach, Cory,  MD  ACETAMINOPHEN PO Take by mouth as needed.    [provider]  albuterol (PROAIR HFA) 108 (90 BASE) MCG/ACT inhaler Inhale 2 puffs into the lungs as needed for wheezing.    [provider]  aspirin 81 MG tablet Take 81 mg by mouth daily.    [provider]  atorvastatin (LIPITOR) 80 MG tablet Take 1 tablet (80 mg total) by mouth daily. 10/14/20   Iran OuchArida, Muhammad A, MD  cetirizine (ZYRTEC) 10 MG tablet Take 10 mg by mouth daily.    [provider]  fluticasone (FLONASE) 50 MCG/ACT nasal spray Place 1 spray into both nostrils daily as needed for allergies or rhinitis.    [provider]  Fluticasone-Salmeterol (ADVAIR) 250-50 MCG/DOSE AEPB Inhale 1 puff into the lungs 2 (two) times daily.    [provider]  ibuprofen (ADVIL,MOTRIN) 200 MG tablet Take 200 mg by mouth every 4 (four) hours as needed for pain.    [provider]  INFLUENZA A, H1N1, MONOVAL PF IM Inject into the muscle.    [provider]  lisinopril (ZESTRIL) 5 MG tablet Take 1 tablet (5 mg total) by mouth daily. 10/14/20   Iran OuchArida, Muhammad A, MD  metoprolol tartrate (LOPRESSOR) 25 MG tablet Take 0.5 tablets (12.5 mg total) by mouth 2 (two) times daily. 10/14/20  Iran Ouch, MD  nitroGLYCERIN (NITROSTAT) 0.4 MG SL tablet Place 1 tablet (0.4 mg total) under the tongue every 5 (five) minutes as needed for chest pain. 01/11/13   Arguello, Roger A, PA-C  TDaP (ADACEL) 01-20-14.5 LF-MCG/0.5 injection Inject 0.5 mLs into the muscle once.    [provider]    Allergies Pollen extract  Family History  Problem Relation Age of Onset  . Heart disease Father   . Heart attack Father     Social History Social History   Tobacco Use  . Smoking status: Never Smoker  . Smokeless tobacco: Never Used  Substance Use Topics  . Alcohol use: No  . Drug use: No    Review of Systems Constitutional: No fever/chills Eyes: No visual changes. ENT: No sore  throat. Cardiovascular: Denies chest pain. Respiratory: Denies shortness of breath. Gastrointestinal: No abdominal pain.  No nausea, no vomiting.  No diarrhea.  No constipation. Genitourinary: Negative for dysuria. Musculoskeletal: Right flank pain. Integumentary: Negative for rash. Neurological: Negative for headaches, focal weakness or numbness.   ____________________________________________   PHYSICAL EXAM:  VITAL SIGNS: ED Triage Vitals  Enc Vitals Group     BP 11/10/20 0537 125/85     Pulse Rate 11/10/20 0537 69     Resp 11/10/20 0537 20     Temp 11/10/20 0537 98.7 F (37.1 C)     Temp Source 11/10/20 0537 Oral     SpO2 11/10/20 0537 100 %     Weight 11/10/20 0532 65.8 kg (145 lb)     Height 11/10/20 0532 1.727 m (5\' 8" )     Head Circumference --      Peak Flow --      Pain Score 11/10/20 0537 7     Pain Loc --      Pain Edu? --      Excl. in GC? --     Constitutional: Alert and oriented.  Eyes: Conjunctivae are normal.  Head: Atraumatic. Nose: No congestion/rhinnorhea. Mouth/Throat: Patient is wearing a mask. Neck: No stridor.  No meningeal signs.   Cardiovascular: Normal rate, regular rhythm. Good peripheral circulation. Respiratory: Normal respiratory effort.  No retractions. Gastrointestinal: Soft and nontender. No distention.  Musculoskeletal: Highly reproducible tenderness to percussion of the right flank. No lower extremity tenderness nor edema. No gross deformities of extremities. Neurologic:  Normal speech and language. No gross focal neurologic deficits are appreciated.  Skin:  Skin is warm, dry and intact. There is no erythema or vesicular lesions to suggest zoster. Psychiatric: Mood and affect are normal. Speech and behavior are normal.  ____________________________________________   LABS (all labs ordered are listed, but only abnormal results are displayed)  Labs Reviewed  URINALYSIS, COMPLETE (UACMP) WITH MICROSCOPIC - Abnormal; Notable for  the following components:      Result Value   Color, Urine YELLOW (*)    APPearance HAZY (*)    All other components within normal limits  BASIC METABOLIC PANEL  CBC   ____________________________________________  EKG  No indication for emergent EKG ____________________________________________  RADIOLOGY I, 11/12/20, personally viewed and evaluated these images (plain radiographs) as part of my medical decision making, as well as reviewing the written report by the radiologist.  ED MD interpretation: No acute abnormalities identified on the patient's CT scan.  Official radiology report(s): CT Renal Stone Study  Result Date: 11/10/2020 CLINICAL DATA:  Flank pain.  Evaluate for kidney stone. EXAM: CT ABDOMEN AND PELVIS WITHOUT CONTRAST TECHNIQUE: Multidetector CT imaging of the  abdomen and pelvis was performed following the standard protocol without IV contrast. COMPARISON:  None FINDINGS: Lower chest: No acute abnormality. Hepatobiliary: Cyst within the central right hepatic lobe measures 1.5 cm, image 14/2. No suspicious liver lesions. No gallstones, gallbladder wall thickening, or biliary dilatation. Pancreas: Unremarkable. No pancreatic ductal dilatation or surrounding inflammatory changes. Spleen: Normal in size without focal abnormality. Adrenals/Urinary Tract: Normal appearance of the adrenal glands. The kidneys are unremarkable. Punctate stone within inferior pole of the left kidney measures 2-3 mm, image 74/5. No right renal calculi identified bilaterally. No hydronephrosis. No hydroureter or ureteral calculi identified. Stomach/Bowel: Stomach appears normal. The appendix is visualized and appears normal. No bowel wall thickening, inflammation, or distension. Vascular/Lymphatic: Aortic atherosclerosis. No aneurysm. No abdominopelvic adenopathy. Reproductive: Prostate gland enlargement. Other: No free fluid or fluid collections within the abdomen or pelvis. Musculoskeletal: No acute  or significant osseous findings. Mild lumbar curvature is convex towards the right. Disc space narrowing and endplate spurring identified within the lumbar spine compatible with degenerative disc disease. IMPRESSION: 1. No acute findings within the abdomen or pelvis. 2. Punctate nonobstructing left renal calculus. 3. Prostate gland enlargement. 4. Aortic atherosclerosis. Aortic Atherosclerosis (ICD10-I70.0). Electronically Signed   By: Signa Kell M.D.   On: 11/10/2020 07:44    ____________________________________________   PROCEDURES   Procedure(s) performed (including Critical Care):  Procedures   ____________________________________________   INITIAL IMPRESSION / MDM / ASSESSMENT AND PLAN / ED COURSE  As part of my medical decision making, I reviewed the following data within the electronic MEDICAL RECORD NUMBER Nursing notes reviewed and incorporated, Labs reviewed , Old chart reviewed and Notes from prior ED visits   Differential diagnosis includes, but is not limited to, musculoskeletal strain, renal/ureteral colic, UTI/pyelonephritis, shingles.  Patient's vital signs are stable and within normal limits. He does not appear to be in distress. He has had no infectious signs or symptoms. Skin exam is reassuring. Urinalysis is pending as is lab work. I will obtain a CT renal stone protocol to make sure there is no evidence of obstructive uropathy. Patient understands that he will likely be treated symptomatically and follow-up as an outpatient.       Clinical Course as of 11/10/20 0900  Sun Nov 10, 2020  0732 Urinalysis, Complete w Microscopic Urine, Clean Catch(!) Normal urinalysis.  Normal basic metabolic panel and CBC. [CF]  0809 No acute abnormalities identified on CT scan.  Updated patient, he is comfortable with MSK treatment and outpatient follow up.  Meds as listed below.  Gave usual/customary return precautions. [CF]    Clinical Course User Index [CF] Loleta Rose, MD      ____________________________________________  FINAL CLINICAL IMPRESSION(S) / ED DIAGNOSES  Final diagnoses:  Right flank pain  Musculoskeletal pain     MEDICATIONS GIVEN DURING THIS VISIT:  Medications  lidocaine (LIDODERM) 5 % 1 patch (has no administration in time range)     ED Discharge Orders         Ordered    lidocaine (LIDODERM) 5 %  Every 12 hours        11/10/20 0814          *Please note:  [name] [name] was evaluated in Emergency Department on 11/10/2020 for the symptoms described in the history of present illness. He was evaluated in the context of the global COVID-19 pandemic, which necessitated consideration that the patient might be at risk for infection with the SARS-CoV-2 virus that causes COVID-19. Institutional protocols and algorithms that  pertain to the evaluation of patients at risk for COVID-19 are in a state of rapid change based on information released by regulatory bodies including the CDC and federal and state organizations. These policies and algorithms were followed during the patient's care in the ED.  Some ED evaluations and interventions may be delayed as a result of limited staffing during and after the pandemic.*  Note:  This document was prepared using Dragon voice recognition software and may include unintentional dictation errors.   Loleta Rose, MD 11/10/20 0900

## 2020-11-10 NOTE — ED Triage Notes (Signed)
Pt states right sided flank and back pain since Wednesday. Pt denies fever, shob, known hematuria, nausea, vomiting.

## 2020-11-10 NOTE — Discharge Instructions (Signed)

## 2020-11-10 NOTE — ED Notes (Signed)
ASSUMED CARE OF PT AT THIS TIME. NAD.  

## 2020-11-21 ENCOUNTER — Other Ambulatory Visit
Admission: RE | Admit: 2020-11-21 | Discharge: 2020-11-21 | Disposition: A | Discharge status: Home / Self Care | Payer: Medicare HMO | Attending: Cardiovascular Disease | Admitting: Cardiovascular Disease

## 2020-11-21 ENCOUNTER — Ambulatory Visit: Payer: Medicare HMO | Admitting: Cardiovascular Disease

## 2020-11-21 ENCOUNTER — Encounter: Payer: Self-pay | Admitting: Cardiovascular Disease

## 2020-11-21 ENCOUNTER — Other Ambulatory Visit: Payer: Self-pay

## 2020-11-21 VITALS — BP 104/64 | HR 61 | Ht 68.0 in | Wt 143.0 lb

## 2020-11-21 DIAGNOSIS — I255 Ischemic cardiomyopathy: Secondary | ICD-10-CM

## 2020-11-21 DIAGNOSIS — I493 Ventricular premature depolarization: Secondary | ICD-10-CM

## 2020-11-21 DIAGNOSIS — I2510 Atherosclerotic heart disease of native coronary artery without angina pectoris: Secondary | ICD-10-CM | POA: Diagnosis not present

## 2020-11-21 DIAGNOSIS — E785 Hyperlipidemia, unspecified: Secondary | ICD-10-CM | POA: Diagnosis present

## 2020-11-21 LAB — LIPID PANEL
Cholesterol: 144 mg/dL (ref 0–200)
HDL: 59 mg/dL (ref >40)
LDL Cholesterol: 78 mg/dL (ref 0–99)
Total CHOL/HDL Ratio: 2.4 RATIO
Triglycerides: 37 mg/dL (ref <150)
VLDL: 7 mg/dL (ref 0–40)

## 2020-11-21 LAB — HEPATIC FUNCTION PANEL
ALT: 37 U/L (ref 0–44)
AST: 44 U/L — ABNORMAL HIGH (ref 15–41)
Albumin: 3.7 g/dL (ref 3.5–5.0)
Alkaline Phosphatase: 80 U/L (ref 38–126)
Bilirubin, Direct: 0.2 mg/dL (ref 0.0–0.2)
Indirect Bilirubin: 1.3 mg/dL — ABNORMAL HIGH (ref 0.3–0.9)
Total Bilirubin: 1.5 mg/dL — ABNORMAL HIGH (ref 0.3–1.2)
Total Protein: 6.3 g/dL — ABNORMAL LOW (ref 6.5–8.1)

## 2020-11-21 LAB — MAGNESIUM: Magnesium: 2.2 mg/dL (ref 1.7–2.4)

## 2020-11-21 NOTE — Patient Instructions (Signed)
Medication Instructions:  Your physician recommends that you continue on your current medications as directed. Please refer to the Current Medication list given to you today.  *If you need a refill on your cardiac medications before your next appointment, please call your pharmacy*   Lab Work: Lipid, Lft, Mag today  If you have labs (blood work) drawn today and your tests are completely normal, you will receive your results only by: Marland Kitchen MyChart Message (if you have MyChart) OR . A paper copy in the mail If you have any lab test that is abnormal or we need to change your treatment, we will call you to review the results.   Testing/Procedures: None ordered   Follow-Up: At [facility], you and your health needs are our priority.  As part of our continuing mission to provide you with exceptional heart care, we have created designated Provider Care Teams.  These Care Teams include your primary Cardiologist (physician) and Advanced Practice Providers (APPs -  Physician Assistants and Nurse Practitioners) who all work together to provide you with the care you need, when you need it.  We recommend signing up for the patient portal called "MyChart".  Sign up information is provided on this After Visit Summary.  MyChart is used to connect with patients for Virtual Visits (Telemedicine).  Patients are able to view lab/test results, encounter notes, upcoming appointments, etc.  Non-urgent messages can be sent to your provider as well.   To learn more about what you can do with MyChart, go to [email or web address].    Your next appointment:   Your physician wants you to follow-up in:1 year You will receive a reminder letter in the mail two months in advance. If you don't receive a letter, please call our office to schedule the follow-up appointment.   The format for your next appointment:   In Person  Provider:   You may see Lorine Bears, MD or one of the following Advanced Practice  Providers on your designated Care Team:    Nicolasa Ducking, NP  Eula Listen, PA-C  Marisue Ivan, PA-C  Cadence Braidwood, [place]  Gillian Shields, NP    Other Instructions N/A

## 2020-11-21 NOTE — Progress Notes (Signed)
Cardiology Office Note   Date:  11/21/2020   ID:  [name] [name], DOB 1950/10/16, MRN [id number]  PCP:  Jenell Milliner, MD  Cardiologist:   Lorine Bears, MD   Chief Complaint  Patient presents with  . Annual Exam    Pt states he is doing well.      History of Present Illness: [name] [name] is a 70 y.o. male who presents for a followup visit regarding coronary artery disease with previous stenting of the LAD/diagonal. 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 He presented to [facility] on 12/28/12 with NSTEMI .  Cardiac catheterization showed 80% prox LAD s/p DES, 90% diagonal s/p DES, 30% prox RCA; EF 40%, mild anterolateral HK and mod apical HK.   Echocardiogram in July of 2014 showed an ejection fraction of 40-45% with mild mitral and aortic regurgitation. He is known to have asymptomatic PVCs.  Echocardiogram was repeated in March 2020 which showed stable ejection fraction of 40 to 45% with mild to moderate mitral regurgitation.   He is doing extremely well and denies any chest pain, shortness of breath or palpitations.  He is compliant with all his medications. He has PVCs but overall is asymptomatic.  He went to the emergency room few weeks ago with right flank pain with negative work-up.  The pain resolved and likely was musculoskeletal.   Past Medical History:  Diagnosis Date  . Asthma   . Coronary artery disease    DES-prox LAD & DES-D1  . Gilbert's disease    mild  . Hay fever   . Hyperlipidemia   . MI (myocardial infarction) (HCC)   . Nasal polyp   . Nasal sinus congestion   . Neuropathy    left arm    Past Surgical History:  Procedure Laterality Date  . CARDIAC CATHETERIZATION  12/29/12   [facility];  80% prox LAD confirmed by IVUS s/p DES, 90% diagonal s/p DES, 30% prox RCA; EF 40%, mild anterolateral HK and mod apical HK  . COLONOSCOPY  2019   6 polyps removed   . NASAL POLYP SURGERY     x2   . PROSTATE BIOPSY  2019     Current Outpatient Medications  Medication Sig Dispense Refill  . ACETAMINOPHEN PO Take by mouth as needed.    Marland Kitchen albuterol (VENTOLIN HFA) 108 (90 Base) MCG/ACT inhaler Inhale 2 puffs into the lungs as needed for wheezing.    Marland Kitchen aspirin 81 MG tablet Take 81 mg by mouth daily.    Marland Kitchen atorvastatin (LIPITOR) 80 MG tablet Take 1 tablet (80 mg total) by mouth daily. 90 tablet 2  . cetirizine (ZYRTEC) 10 MG tablet Take 10 mg by mouth daily.    . fluticasone (FLONASE) 50 MCG/ACT nasal spray Place 1 spray into both nostrils daily as needed for allergies or rhinitis.    . Fluticasone-Salmeterol (ADVAIR) 250-50 MCG/DOSE AEPB Inhale 1 puff into the lungs 2 (two) times daily.    Marland Kitchen ibuprofen (ADVIL,MOTRIN) 200 MG tablet Take 200 mg by mouth every 4 (four) hours as needed for pain.    . INFLUENZA A, H1N1, MONOVAL PF IM Inject into the muscle.    . lisinopril (ZESTRIL) 5 MG tablet Take 1 tablet (5 mg total) by mouth daily. 90 tablet 2  . metoprolol tartrate (LOPRESSOR) 25 MG tablet Take 0.5 tablets (12.5 mg total) by mouth 2 (two) times daily. 90 tablet 2  . nitroGLYCERIN (NITROSTAT) 0.4 MG SL tablet Place  1 tablet (0.4 mg total) under the tongue every 5 (five) minutes as needed for chest pain. 25 tablet 3  . nystatin (MYCOSTATIN) 100000 UNIT/ML suspension Take 5 mLs by mouth in the morning.    . TDaP (ADACEL) 01-20-14.5 LF-MCG/0.5 injection Inject 0.5 mLs into the muscle once.     No current facility-administered medications for this visit.    Allergies:   Pollen extract    Social History:  The patient  reports that he has never smoked. He has never used smokeless tobacco. He reports that he does not drink alcohol and does not use drugs.   Family History:  The patient's family history includes Heart attack in his father; Heart disease in his father.    ROS:  Please see the history of present illness.   Otherwise, review of systems are positive for none.   All other systems  are reviewed and negative.    PHYSICAL EXAM: VS:  BP 104/64   Pulse 61   Ht 5\' 8"  (1.727 m)   Wt 143 lb (64.9 kg)   BMI 21.74 kg/m  , BMI Body mass index is 21.74 kg/m. GEN: Well nourished, well developed, in no acute distress  HEENT: normal  Neck: no JVD, carotid bruits, or masses Cardiac: RRR; no murmurs, rubs, or gallops,no edema  Respiratory:  clear to auscultation bilaterally, normal work of breathing GI: soft, nontender, nondistended, + BS MS: no deformity or atrophy  Skin: warm and dry, no rash Neuro:  Strength and sensation are intact Psych: euthymic mood, full affect   EKG:  EKG is ordered today. The ekg ordered today sinus rhythm with PVCs.   Recent Labs: 11/10/2020: BUN 16; Creatinine, Ser 0.82; Hemoglobin 14.6; Platelets 157; Potassium 4.1; Sodium 137    Lipid Panel No results found for: CHOL, TRIG, HDL, CHOLHDL, VLDL, LDLCALC, LDLDIRECT    Wt Readings from Last 3 Encounters:  11/21/20 143 lb (64.9 kg)  11/10/20 145 lb (65.8 kg)  11/16/19 150 lb 8 oz (68.3 kg)        No flowsheet data found.    ASSESSMENT AND PLAN:  1.Coronary artery disease involving native coronary arteries without angina: He is doing extremely well. Continue medical therapy.  2. 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 Continue small dose metoprolol and lisinopril.  No evidence of volume overload.  3. Hyperlipidemia:  He is currently on high-dose atorvastatin 80 mg daily.  I am going to obtain a follow-up lipid and liver profile.  He did have slightly elevated liver enzymes on atorvastatin and that has to be monitored.  4.  PVCs: He continues to be asymptomatic.  I will check magnesium level.   Disposition:   FU with me in 1 year  Signed,  11/18/19, MD  11/21/2020 9:47 AM    [facility]

## 2021-07-09 ENCOUNTER — Other Ambulatory Visit: Payer: Self-pay | Admitting: Cardiovascular Disease

## 2021-09-18 IMAGING — CT CT RENAL STONE PROTOCOL
2 of 4 series · 16 of 46 positions shown, 18 images · non-contrast
Comparison: None

CLINICAL DATA: Flank pain.  Evaluate for kidney stone.

EXAM:
CT ABDOMEN AND PELVIS WITHOUT CONTRAST
TECHNIQUE: Multidetector CT imaging of the abdomen and pelvis was performed
following the standard protocol without IV contrast.

[Series 2: stone full standard · axial · 0.73mm/px · z∈[-467,-107]mm · 13 of 80 slices shown, 15 images]
[im 4/80  soft-tissue]
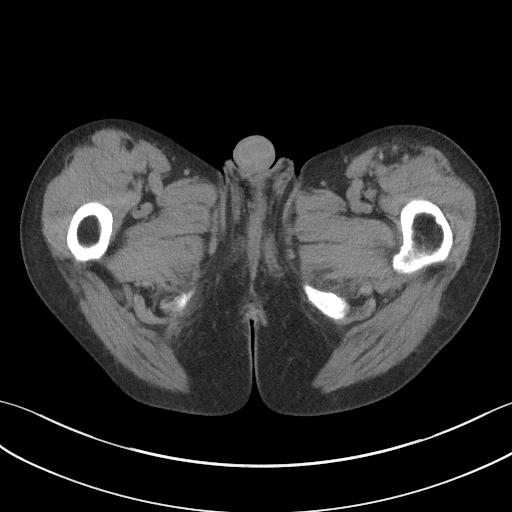
[im 4/80  bone]
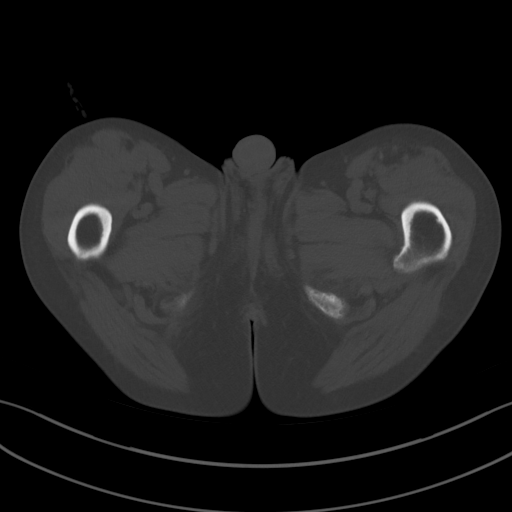
[im 10/80  soft-tissue]
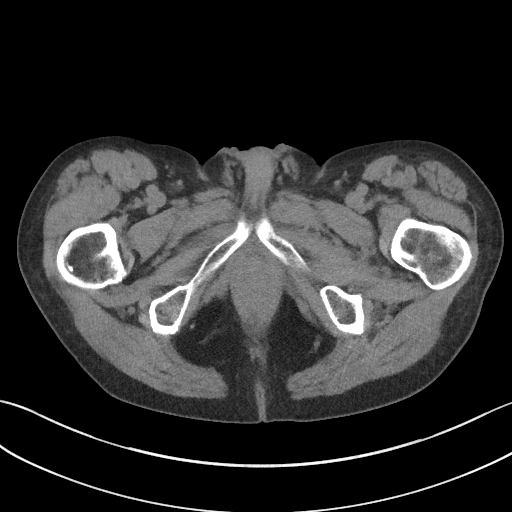
[im 16/80  soft-tissue]
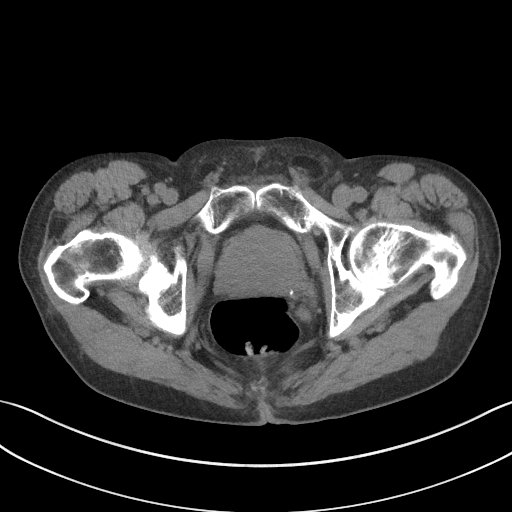
[im 22/80  soft-tissue]
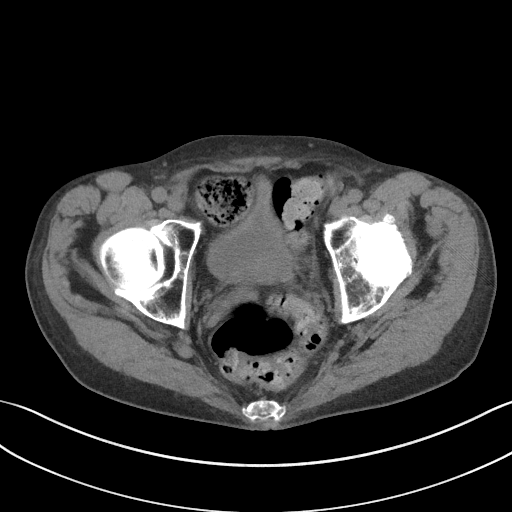
[im 28/80  soft-tissue]
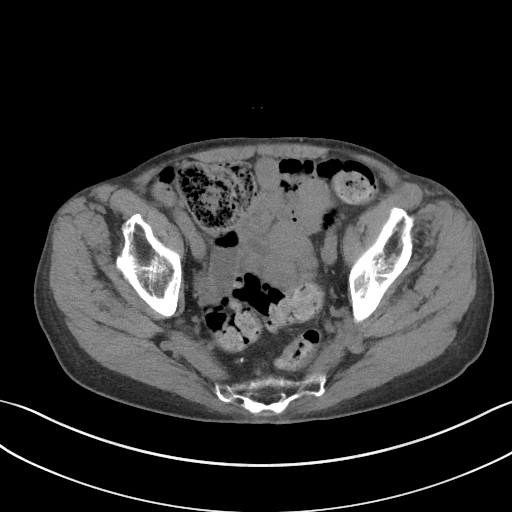
[im 34/80  soft-tissue]
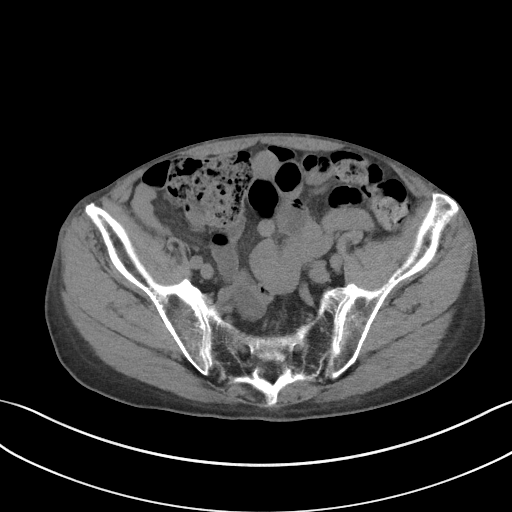
[im 40/80  soft-tissue]
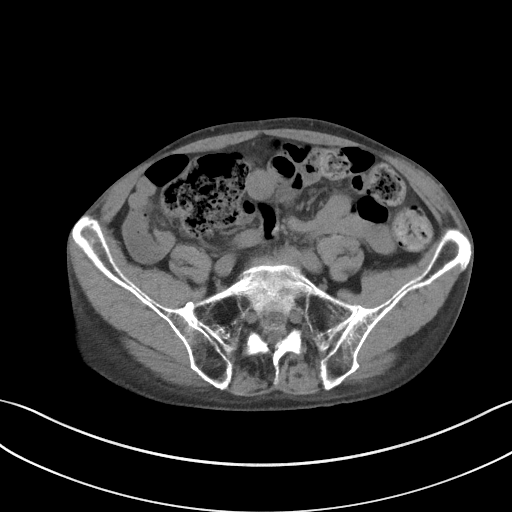
[im 46/80  soft-tissue]
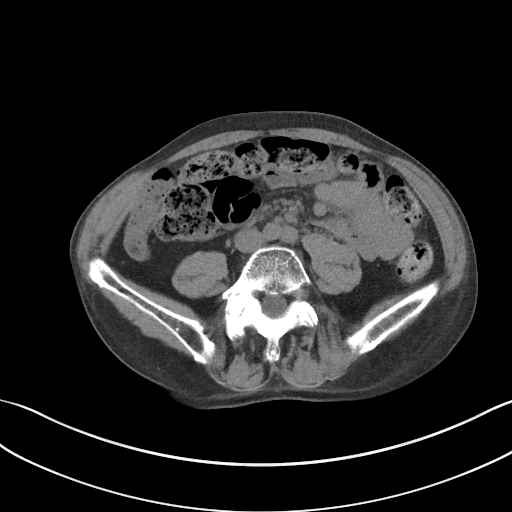
[im 52/80  soft-tissue]
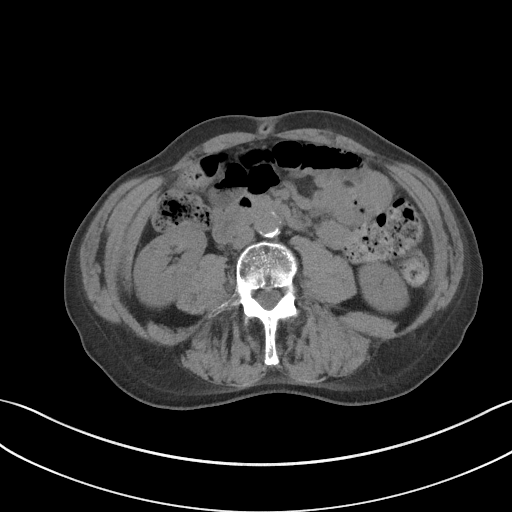
[im 52/80  bone]
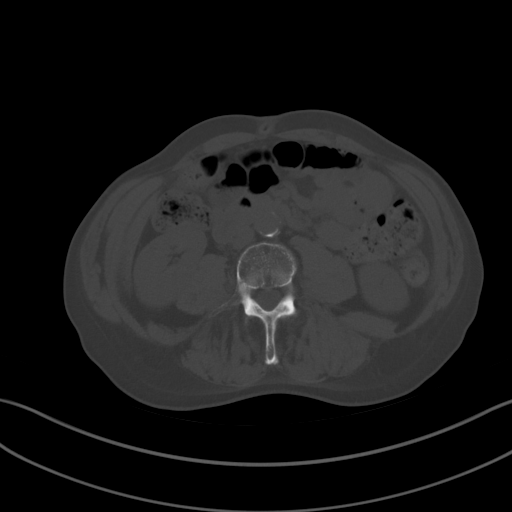
[im 58/80  soft-tissue]
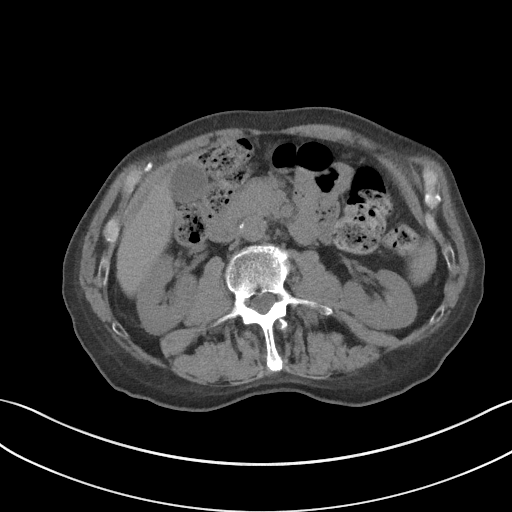
[im 64/80  soft-tissue]
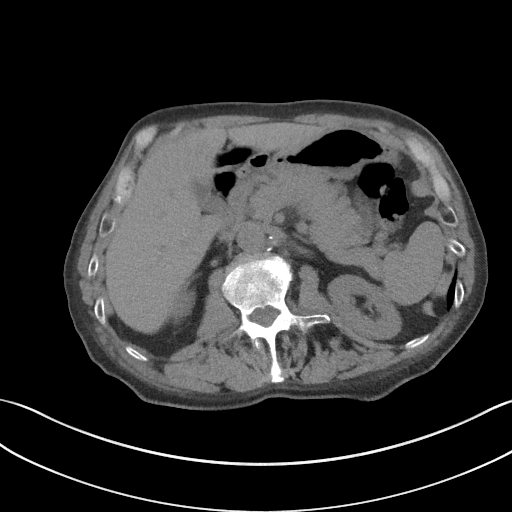
[im 70/80  soft-tissue]
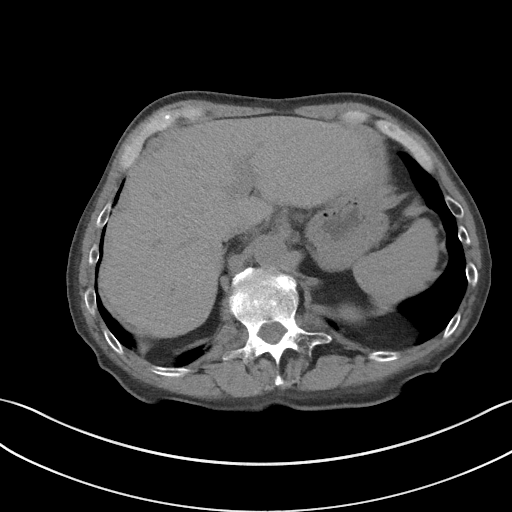
[im 76/80  soft-tissue]
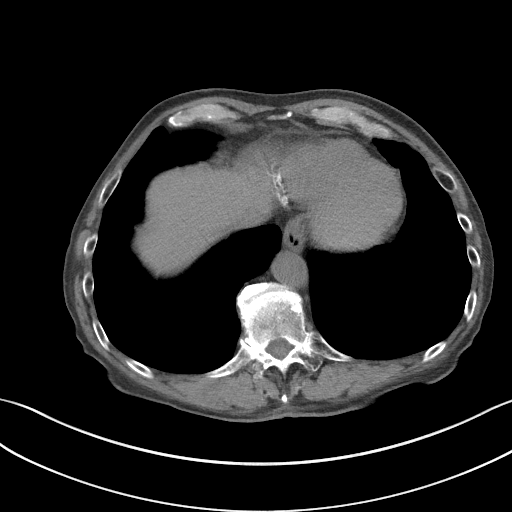

[Series 5: coronal · coronal · 0.70mm/px · 3 of 127 slices shown]
[im 43/127  soft-tissue]
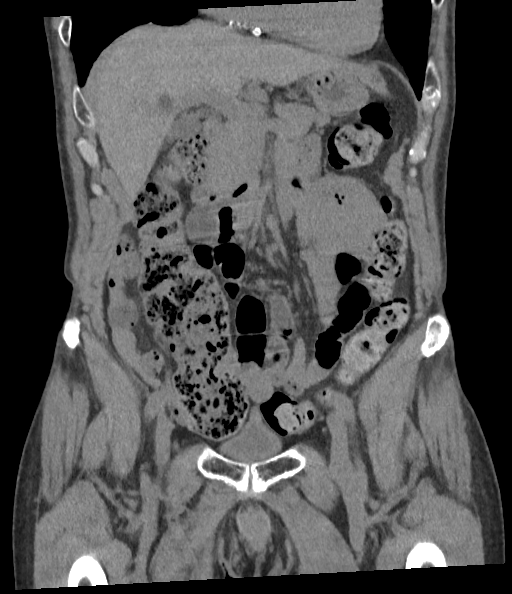
[im 57/127  soft-tissue]
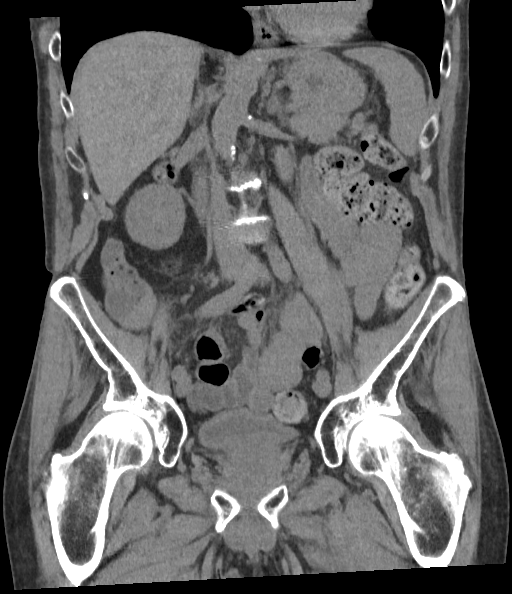
[im 71/127  soft-tissue]
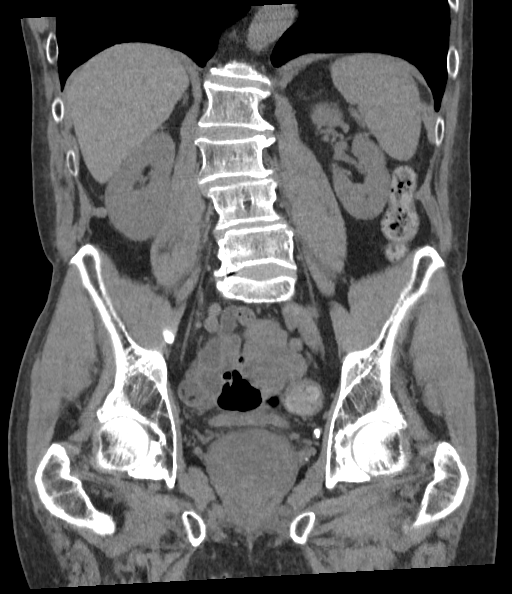

[16 of 46 positions shown; findings below may reference images not displayed]

FINDINGS: Lower chest: No acute abnormality.

Hepatobiliary: Cyst within the central right hepatic lobe measures
1.5 cm, image [DATE]. No suspicious liver lesions. No gallstones,
gallbladder wall thickening, or biliary dilatation.

Pancreas: Unremarkable. No pancreatic ductal dilatation or
surrounding inflammatory changes.

Spleen: Normal in size without focal abnormality.

Adrenals/Urinary Tract: Normal appearance of the adrenal glands. The
kidneys are unremarkable. Punctate stone within inferior pole of the
left kidney measures 2-3 mm, image 74/5. No right renal calculi
identified bilaterally. No hydronephrosis. No hydroureter or
ureteral calculi identified.

Stomach/Bowel: Stomach appears normal. The appendix is visualized
and appears normal. No bowel wall thickening, inflammation, or
distension.

Vascular/Lymphatic: Aortic atherosclerosis. No aneurysm. No
abdominopelvic adenopathy.

Reproductive: Prostate gland enlargement.

Other: No free fluid or fluid collections within the abdomen or
pelvis.

Musculoskeletal: No acute or significant osseous findings. Mild
lumbar curvature is convex towards the right. Disc space narrowing
and endplate spurring identified within the lumbar spine compatible
with degenerative disc disease.
IMPRESSION: 1. No acute findings within the abdomen or pelvis.
2. Punctate nonobstructing left renal calculus.
3. Prostate gland enlargement.
4. Aortic atherosclerosis.

Aortic Atherosclerosis ([id number]).

## 2021-10-03 ENCOUNTER — Other Ambulatory Visit: Payer: Self-pay | Admitting: Cardiovascular Disease

## 2021-10-03 NOTE — Telephone Encounter (Signed)
Please schedule 1 yr F/U appointment. Thank you! °

## 2021-11-26 ENCOUNTER — Encounter: Payer: Self-pay | Admitting: Medical

## 2021-11-26 ENCOUNTER — Other Ambulatory Visit: Payer: Self-pay

## 2021-11-26 ENCOUNTER — Ambulatory Visit: Payer: Medicare HMO | Admitting: Medical

## 2021-11-26 VITALS — BP 108/70 | HR 61 | Ht 68.0 in | Wt 147.8 lb

## 2021-11-26 DIAGNOSIS — E785 Hyperlipidemia, unspecified: Secondary | ICD-10-CM

## 2021-11-26 DIAGNOSIS — I255 Ischemic cardiomyopathy: Secondary | ICD-10-CM | POA: Diagnosis not present

## 2021-11-26 DIAGNOSIS — I2510 Atherosclerotic heart disease of native coronary artery without angina pectoris: Secondary | ICD-10-CM

## 2021-11-26 DIAGNOSIS — R600 Localized edema: Secondary | ICD-10-CM

## 2021-11-26 DIAGNOSIS — I493 Ventricular premature depolarization: Secondary | ICD-10-CM

## 2021-11-26 MED ORDER — EMPAGLIFLOZIN 10 MG PO TABS: 10.0000 mg | ORAL_TABLET | Freq: Every day | ORAL | 3 refills | Status: DC

## 2021-11-26 NOTE — Progress Notes (Signed)
?Cardiology Office Note:   ? ?Date:  11/26/2021  ? ?ID:  [name] [name], DOB 1950/12/12, MRN [id number] ? ?PCP:  Danae Orleans, MD  ?[facility] Cardiologist: Dr. Fletcher Anon ?[place] Electrophysiologist:  None  ? ?Referring MD: Danae Orleans, MD  ? ?Chief Complaint: 12 month follow-up ? ?History of Present Illness:   ? ?[name] [name] is a 71 y.o. male with a hx of CAD, PVCs, asthma, HLD, HFmrEF, ICM, Gilbert's disease who presents for follow-up.  ? ?He presented to [facility] on 12/28/12 with NSTEMI. Cardiac cath showed 80% prox LAD s/p DES, 90% diagnonal s/p DES, 30% pox RCA, EF 40%, mild anterolateal HK and mod apical HK. Echo in July 2014 showed an EF of 40-45% with mild mitral and aortic regurgitation.  ? ?H/o symptomatic PVCs. Echo in March 2020 showed LVEF 40-45%, mild to mod MR.  ? ?Last seen 11/21/20 and was doing well.  ? ?Today, the patient reports he has been doing well since the last visit. No chest pain or shortness of breath. Has lower leg swelling which is chronic and unchanged. He wears compression socks, which seems to help. No orthopnea, pnd. Has occasional palpitations. He does not do  much activity, sometimes walks during the week. Diet is good, mostly cooking at home. He had labs earlier this year.  ? ?Past Medical History:  ?Diagnosis Date  ? Asthma   ? Coronary artery disease   ? DES-prox LAD & DES-D1  ? Gilbert's disease   ? mild  ? Hay fever   ? Hyperlipidemia   ? MI (myocardial infarction) ([place])   ? Nasal polyp   ? Nasal sinus congestion   ? Neuropathy   ? left arm  ? ? ?Past Surgical History:  ?Procedure Laterality Date  ? CARDIAC CATHETERIZATION  12/29/12  ? [facility];  80% prox LAD confirmed by IVUS s/p DES, 90% diagonal s/p DES, 30% prox RCA; EF 40%, mild anterolateral HK and mod apical HK  ? COLONOSCOPY  2019  ? 6 polyps removed   ? NASAL POLYP SURGERY    ? x2  ? PROSTATE BIOPSY  2019  ? ? ?Current Medications: ?Current Meds  ?Medication Sig  ? ACETAMINOPHEN PO Take by mouth as  needed.  ? aspirin 81 MG tablet Take 81 mg by mouth daily.  ? atorvastatin (LIPITOR) 80 MG tablet Take 1 tablet (80 mg total) by mouth daily. PLEASE SCHEDULE OFFICE VISIT FOR FURTHER REFILLS. THANK YOU!  ? cetirizine (ZYRTEC) 10 MG tablet Take 10 mg by mouth daily.  ? empagliflozin (JARDIANCE) 10 MG TABS tablet Take 1 tablet (10 mg total) by mouth daily before breakfast.  ? fluticasone (FLONASE) 50 MCG/ACT nasal spray Place 1 spray into both nostrils daily as needed for allergies or rhinitis.  ? Fluticasone-Salmeterol (ADVAIR) 250-50 MCG/DOSE AEPB Inhale 1 puff into the lungs 2 (two) times daily.  ? ibuprofen (ADVIL,MOTRIN) 200 MG tablet Take 200 mg by mouth every 4 (four) hours as needed for pain.  ? INFLUENZA A, H1N1, MONOVAL PF IM Inject into the muscle.  ? lisinopril (ZESTRIL) 5 MG tablet Take 1 tablet (5 mg total) by mouth daily. PLEASE SCHEDULE APPOINTMENT FOR FURTHER REFILLS. THANK YOU!  ? metoprolol tartrate (LOPRESSOR) 25 MG tablet Take 0.5 tablets (12.5 mg total) by mouth 2 (two) times daily. PLEASE SCHEDULE APPOINTMENT FOR FURTHER REFILLS. THANK YOU!  ? nitroGLYCERIN (NITROSTAT) 0.4 MG SL tablet Place 1 tablet (0.4 mg total) under the tongue every 5 (five) minutes as needed for  chest pain.  ? nystatin (MYCOSTATIN) 100000 UNIT/ML suspension Take 5 mLs by mouth in the morning.  ? TDaP (ADACEL) 01-20-14.5 LF-MCG/0.5 injection Inject 0.5 mLs into the muscle once.  ?  ? ?Allergies:   Pollen extract  ? ?Social History  ? ?Socioeconomic History  ? Marital status: Single  ?  Spouse name: Not on file  ? Number of children: Not on file  ? Years of education: Not on file  ? Highest education level: Not on file  ?Occupational History  ? Not on file  ?Tobacco Use  ? Smoking status: Never  ? Smokeless tobacco: Never  ?Substance and Sexual Activity  ? Alcohol use: No  ? Drug use: No  ? Sexual activity: Not on file  ?Other Topics Concern  ? Not on file  ?Social History Narrative  ? Not on file  ? ?Social Determinants of  Health  ? ?Financial Resource Strain: Not on file  ?Food Insecurity: Not on file  ?Transportation Needs: Not on file  ?Physical Activity: Not on file  ?Stress: Not on file  ?Social Connections: Not on file  ?  ? ?Family History: ?The patient's family history includes Heart attack in his father; Heart disease in his father. ? ?ROS:   ?Please see the history of present illness.    ? All other systems reviewed and are negative. ? ?EKGs/Labs/Other Studies Reviewed:   ? ?The following studies were reviewed today: ? ?Echo 11/2018 ?1. The left ventricle has mild-moderately reduced systolic function, with  ?an ejection fraction of 40-45%. The cavity size was normal. Left  ?ventricular diastolic Doppler parameters are consistent with impaired  ?relaxation.Challenging images, with concern  ?for hypokinesis of the inferior wall noted.  ? 2. The right ventricle has normal systolic function. The cavity was  ?normal. There is no increase in right ventricular wall thickness. Right  ?ventricular systolic pressure is mildly elevated with an estimated  ?pressure of 34.4 mmHg.  ? 3. Left atrial size was mildly dilated.  ? 4. The tricuspid valve is grossly normal. Tricuspid valve regurgitation  ?is mild-moderate.  ? 5. Mitral valve regurgitation is mild to moderate by color flow Doppler.  ? ?EKG:  EKG is  ordered today.  The ekg ordered today demonstrates NSR, 61bpm, LAD, no PVCs ? ?Recent Labs: ?No results found for requested labs within last 8760 hours.  ?Recent Lipid Panel ?   ?Component Value Date/Time  ? CHOL 144 11/21/2020 1015  ? TRIG 37 11/21/2020 1015  ? HDL 59 11/21/2020 1015  ? CHOLHDL 2.4 11/21/2020 1015  ? VLDL 7 11/21/2020 1015  ? LDLCALC 78 11/21/2020 1015  ? ? ?Physical Exam:   ? ?VS:  BP 108/70 (BP Location: Left Arm, Patient Position: Sitting, Cuff Size: Normal)   Pulse 61   Ht 5\' 8"  (1.727 m)   Wt 147 lb 12 oz (67 kg)   SpO2 97%   BMI 22.47 kg/m?    ? ?Wt Readings from Last 3 Encounters:  ?11/26/21 147 lb 12 oz  (67 kg)  ?11/21/20 143 lb (64.9 kg)  ?11/10/20 145 lb (65.8 kg)  ?  ? ?GEN:  Well nourished, well developed in no acute distress ?HEENT: Normal ?NECK: No JVD; No carotid bruits ?LYMPHATICS: No lymphadenopathy ?CARDIAC: RRR, no murmurs, rubs, gallops ?RESPIRATORY:  Clear to auscultation without rales, wheezing or rhonchi  ?ABDOMEN: Soft, non-tender, non-distended ?MUSCULOSKELETAL:  1+ pedal edema; No deformity  ?SKIN: Warm and dry ?NEUROLOGIC:  Alert and oriented x 3 ?PSYCHIATRIC:  Normal  affect  ? ?ASSESSMENT:   ? ?1. Coronary artery disease involving native coronary artery of native heart without angina pectoris   ?2. PVC (premature ventricular contraction)   ?3. Ischemic cardiomyopathy   ?4. Hyperlipidemia, unspecified hyperlipidemia type   ?5. Lower leg edema   ? ?PLAN:   ? ?In order of problems listed above: ? ?CAD ?Patient denies anginal symptoms. No further ischemic work-up indicated at this time. Continue Aspirin, Lipitor and Lopressor.  ? ?LLE ?Mild ischemic CM ?Echo 11/2018 showed LVEF 40-45%. He is on metoprolol and lisinopril. He has pedal edema on exam. I will repeat an echocardiogram. I will start Jardiance 10mg  daily, BMET in a week. We will see him back after the echocardiogram.  ? ?HLD ?LDL 66 10/2021, continue Lipitor. ? ?PVCs ?PVCS not seen on EKG today. Reports occasional palpitations.  ? ?Disposition: Follow up in 6-8 week(s) with MD/APP  ? ? ? ?Signed, ?Casara Perrier Ninfa Meeker, PA-C  ?11/26/2021 11:42 AM    ?[place] ? ?

## 2021-11-26 NOTE — Patient Instructions (Signed)
Medication Instructions:  ?Your physician has recommended you make the following change in your medication:  ? ?START taking empagliflozin (Jardiance) 10 mg daily before breakfast  ? ?*If you need a refill on your cardiac medications before your next appointment, please call your pharmacy* ? ? ?Lab Work: ?Your physician recommends that you return for lab work (BMET) in: 1 week ? ? ? ?Please return to our office on_____________________at______________am/pm  ? ?If you have labs (blood work) drawn today and your tests are completely normal, you will receive your results only by: ?MyChart Message (if you have MyChart) OR ?A paper copy in the mail ?If you have any lab test that is abnormal or we need to change your treatment, we will call you to review the results. ? ? ?Testing/Procedures: ?Echocardiogram - Your physician has requested that you have an echocardiogram. Echocardiography is a painless test that uses sound waves to create images of your heart. It provides your doctor with information about the size and shape of your heart and how well your heart?s chambers and valves are working. This procedure takes approximately one hour. There are no restrictions for this procedure.  ? ?Follow-Up: ?At [facility], you and your health needs are our priority.  As part of our continuing mission to provide you with exceptional heart care, we have created designated Provider Care Teams.  These Care Teams include your primary Cardiologist (physician) and Advanced Practice Providers (APPs -  Physician Assistants and Nurse Practitioners) who all work together to provide you with the care you need, when you need it. ? ?We recommend signing up for the patient portal called "MyChart".  Sign up information is provided on this After Visit Summary.  MyChart is used to connect with patients for Virtual Visits (Telemedicine).  Patients are able to view lab/test results, encounter notes, upcoming appointments, etc.  Non-urgent messages  can be sent to your provider as well.   ?To learn more about what you can do with MyChart, go to [email or web address].   ? ?Your next appointment:   ?6-8 week(s) ? ?The format for your next appointment:   ?In Person ? ?Provider:   ?You may see Kathlyn Sacramento, MD or one of the following Advanced Practice Providers on your designated Care Team:   ?Murray Hodgkins, NP ?Christell Faith, PA-C ?Cadence Kathlen Mody, PA-C ? ? ?Other Instructions ?N/A ?

## 2021-11-28 ENCOUNTER — Telehealth: Payer: Self-pay | Admitting: Cardiovascular Disease

## 2021-11-28 MED ORDER — DAPAGLIFLOZIN PROPANEDIOL 10 MG PO TABS: 10.0000 mg | ORAL_TABLET | Freq: Every day | ORAL | 3 refills | Status: DC

## 2021-11-28 NOTE — Telephone Encounter (Signed)
Spoke with patient and he has not yet started [place]. He wants to wait until after his echocardiogram and then discuss the need for this medication at appointment next month. Advised that I would make provider aware and confirmed upcoming appointments. He was appreciative for the follow up with no further needs at this time.  ?

## 2021-11-28 NOTE — Telephone Encounter (Signed)
Patient called requesting to cancel lab appt til after the echo appt 3/23. States he will not start jardiance til after currently not taking.  ?

## 2021-12-03 ENCOUNTER — Other Ambulatory Visit: Payer: Medicare HMO

## 2021-12-11 ENCOUNTER — Ambulatory Visit (INDEPENDENT_AMBULATORY_CARE_PROVIDER_SITE_OTHER): Payer: Medicare HMO

## 2021-12-11 ENCOUNTER — Other Ambulatory Visit: Payer: Medicare HMO

## 2021-12-11 ENCOUNTER — Other Ambulatory Visit: Payer: Self-pay

## 2021-12-11 DIAGNOSIS — I255 Ischemic cardiomyopathy: Secondary | ICD-10-CM

## 2021-12-11 LAB — ECHOCARDIOGRAM COMPLETE
AR max vel: 2.85 cm2
AV Area VTI: 2.54 cm2
AV Area mean vel: 2.64 cm2
AV Mean grad: 3 mmHg
AV Peak grad: 4.7 mmHg
Ao pk vel: 1.08 m/s
Area-P 1/2: 2.91 cm2
P 1/2 time: 580 msec
S' Lateral: 3.6 cm

## 2021-12-11 MED ORDER — PERFLUTREN LIPID MICROSPHERE
1.0000 mL | INTRAVENOUS | Status: AC | PRN
  Administered 2021-12-11: 2 mL via INTRAVENOUS

## 2021-12-15 ENCOUNTER — Telehealth: Payer: Self-pay | Admitting: Emergency Medicine

## 2021-12-15 DIAGNOSIS — I255 Ischemic cardiomyopathy: Secondary | ICD-10-CM

## 2021-12-15 NOTE — Telephone Encounter (Signed)
-----   Message from [place], PA-C sent at 12/12/2021  2:35 PM EDT ----- ?Echo showed mildly improved pump function 45-50%, global HK, severely dilated RA, mildly leaky mitral and aortic valve.  ? ?

## 2021-12-15 NOTE — Telephone Encounter (Signed)
Called and spoke with patient. Reviewed results. Pt verbalized understanding.  ? ?Patient stated that he would be willing to start Jardiance.  ? ?Stated that he would call the pharmacy to have a previously sent in RX filled.  ? ?BMET scheduled for 1 week and f/u pushed out to 4 weeks after BMET.  ?

## 2021-12-24 ENCOUNTER — Other Ambulatory Visit: Payer: Self-pay

## 2021-12-24 ENCOUNTER — Other Ambulatory Visit (INDEPENDENT_AMBULATORY_CARE_PROVIDER_SITE_OTHER): Payer: Medicare HMO

## 2021-12-24 ENCOUNTER — Other Ambulatory Visit: Payer: Medicare HMO

## 2021-12-24 DIAGNOSIS — I255 Ischemic cardiomyopathy: Secondary | ICD-10-CM

## 2021-12-25 LAB — BASIC METABOLIC PANEL
BUN/Creatinine Ratio: 19 (ref 10–24)
BUN: 14 mg/dL (ref 8–27)
CO2: 20 mmol/L (ref 20–29)
Calcium: 9.2 mg/dL (ref 8.6–10.2)
Chloride: 101 mmol/L (ref 96–106)
Creatinine, Ser: 0.73 mg/dL — ABNORMAL LOW (ref 0.76–1.27)
Glucose: 87 mg/dL (ref 70–99)
Potassium: 4.1 mmol/L (ref 3.5–5.2)
Sodium: 136 mmol/L (ref 134–144)
eGFR: 97 mL/min/{1.73_m2} (ref >59)

## 2022-01-07 ENCOUNTER — Ambulatory Visit: Payer: Medicare HMO | Admitting: Nurse Practitioner

## 2022-01-13 ENCOUNTER — Other Ambulatory Visit: Payer: Self-pay | Admitting: Cardiovascular Disease

## 2022-01-16 ENCOUNTER — Ambulatory Visit: Payer: Medicare HMO | Admitting: Nurse Practitioner

## 2022-01-16 ENCOUNTER — Encounter: Payer: Self-pay | Admitting: Nurse Practitioner

## 2022-01-16 VITALS — BP 110/70 | HR 63 | Ht 68.0 in | Wt 138.5 lb

## 2022-01-16 DIAGNOSIS — I255 Ischemic cardiomyopathy: Secondary | ICD-10-CM

## 2022-01-16 DIAGNOSIS — E785 Hyperlipidemia, unspecified: Secondary | ICD-10-CM | POA: Diagnosis not present

## 2022-01-16 DIAGNOSIS — I5022 Chronic systolic (congestive) heart failure: Secondary | ICD-10-CM

## 2022-01-16 DIAGNOSIS — I493 Ventricular premature depolarization: Secondary | ICD-10-CM

## 2022-01-16 DIAGNOSIS — I2510 Atherosclerotic heart disease of native coronary artery without angina pectoris: Secondary | ICD-10-CM

## 2022-01-16 NOTE — Patient Instructions (Signed)
Medication Instructions:  ?Your physician has recommended you make the following change in your medication:  ? ?STOP Jardiance  ? ?*If you need a refill on your cardiac medications before your next appointment, please call your pharmacy* ? ? ?Lab Work: ?None ? ?If you have labs (blood work) drawn today and your tests are completely normal, you will receive your results only by: ?MyChart Message (if you have MyChart) OR ?A paper copy in the mail ?If you have any lab test that is abnormal or we need to change your treatment, we will call you to review the results. ? ? ?Testing/Procedures: ?None ? ? ?Follow-Up: ?At [facility], you and your health needs are our priority.  As part of our continuing mission to provide you with exceptional heart care, we have created designated Provider Care Teams.  These Care Teams include your primary Cardiologist (physician) and Advanced Practice Providers (APPs -  Physician Assistants and Nurse Practitioners) who all work together to provide you with the care you need, when you need it. ? ? ?Your next appointment:   ?1 year(s) ? ?The format for your next appointment:   ?In Person ? ?Provider:   ?Kathlyn Sacramento, MD ONLY ? ? ? ? ?Important Information About Sugar ? ? ? ? ?  ?

## 2022-01-16 NOTE — Progress Notes (Signed)
? ? ?Office Visit  ?  ?Patient Name: [name] [name] ?Date of Encounter: 01/16/2022 ? ?Primary Care Provider:  Jenell MillinerLuyando, Yvonne, MD ?Primary Cardiologist:  Lorine BearsMuhammad Arida, MD ? ?Chief Complaint  ?  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 ? ?Past Medical History  ?  ?Past Medical History:  ?Diagnosis Date  ? Asthma   ? Chronic HFmrEF (heart failure with mid-range ejection fraction) (HCC)   ? a. 10/2012 Echo: EF 40-45%; b. 10/2018 Echo: EF 40-45%; c. 11/2021 Echo: EF 45-50%.  ? Coronary artery disease   ? a. 12/2012 NSTEMI/PCI: LM nl, LAD 80p (3.0x15 Xience DES), D1 90 (3.0x15 Xience DES), LCX/OM1/2/3 min irregs, RCA large, 30p, RPDA nl. EF 40%.  ? Gilbert's disease   ? mild  ? Hay fever   ? Hyperlipidemia   ? Ischemic cardiomyopathy   ? a. 10/2012 Echo: EF 40-45%; b. 10/2018 Echo: EF 40-45%; c. 11/2021 Echo: EF 45-50%, glob HK, nl RV fxn, mod enlarged RV, sev dil RA, mild MR/AI.  ? MI (myocardial infarction) (HCC)   ? Nasal polyp   ? Nasal sinus congestion   ? Neuropathy   ? left arm  ? PVC's (premature ventricular contractions)   ? ?Past Surgical History:  ?Procedure Laterality Date  ? CARDIAC CATHETERIZATION  12/29/12  ? [facility];  80% prox LAD confirmed by IVUS s/p DES, 90% diagonal s/p DES, 30% prox RCA; EF 40%, mild anterolateral HK and mod apical HK  ? COLONOSCOPY  2019  ? 6 polyps removed   ? NASAL POLYP SURGERY    ? x2  ? PROSTATE BIOPSY  2019  ? ? ?Allergies ? ?Allergies  ?Allergen Reactions  ? Pollen Extract   ?  Other reaction(s): Other (See Comments) ?Seasonal allergy symptoms  ? ? ?History of Present Illness  ?  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  In April 2014, he presented to [facility] with chest pain and ruled in for non-STEMI.  Diagnostic catheterization revealed severe proximal LAD and diagonal disease and otherwise nonobstructive disease.  EF was 40 to 45%.  Both the LAD and diagonal were treated with Xience drug-eluting stents.  Follow-up echocardiogram in February 2020 showed persistent LV dysfunction with an EF of 40 to 45%.  He has been medically managed and generally has done well over the years however, at follow-up in March 2023, he reported lower extremity swelling and he was noted to have mild pedal edema.  He was placed on Jardiance 10 mg daily and an echocardiogram was performed, which showed slight improvement in EF to 45 to 50% with global hypokinesis, normal RV function, moderately enlarged RV, severely dilated right atrium, and mild MR/AI.  Follow-up basic metabolic panel on April 5 showed stable renal function and electrolytes. ? ?Since his last visit, he has felt well.  He works outside often without symptoms or limitations.  Neck, dependent ankle and pedal edema, which is minimal in the morning and progresses throughout the day while he is on his feet.  He does have compression socks that he bought from [employer] but he does not think that there very tight.  He plans to look for a better pair.  He has tolerated Jardiance but  after thinking about it, would like to come off of it.  He denies chest pain, dyspnea, palpitations, PND, orthopnea, dizziness, syncope, or early satiety. ? ?Home Medications  ?  ?Current Outpatient Medications  ?Medication Sig Dispense Refill  ? ACETAMINOPHEN PO Take by mouth as needed.    ? aspirin 81 MG tablet Take 81 mg by mouth daily.    ? atorvastatin (LIPITOR) 80 MG tablet Take 1 tablet (80 mg total) by mouth daily. 90 tablet 0  ? cetirizine (ZYRTEC) 10 MG tablet Take 10 mg by mouth daily.    ? fluticasone (FLONASE) 50 MCG/ACT nasal spray Place 1 spray into  both nostrils daily as needed for allergies or rhinitis.    ? Fluticasone-Salmeterol (ADVAIR) 250-50 MCG/DOSE AEPB Inhale 1 puff into the lungs 2 (two) times daily.    ? ibuprofen (ADVIL,MOTRIN) 200 MG tablet Take 200 mg by mouth every 4 (four) hours as needed for pain.    ? INFLUENZA A, H1N1, MONOVAL PF IM Inject into the muscle.    ? JARDIANCE 10 MG TABS tablet Take 10 mg by mouth every morning.    ? lisinopril (ZESTRIL) 5 MG tablet Take 1 tablet (5 mg total) by mouth daily. 90 tablet 0  ? metoprolol tartrate (LOPRESSOR) 25 MG tablet Take 0.5 tablets (12.5 mg total) by mouth 2 (two) times daily. 90 tablet 0  ? nitroGLYCERIN (NITROSTAT) 0.4 MG SL tablet Place 1 tablet (0.4 mg total) under the tongue every 5 (five) minutes as needed for chest pain. 25 tablet 3  ? nystatin (MYCOSTATIN) 100000 UNIT/ML suspension Take 5 mLs by mouth in the morning.    ? TDaP (ADACEL) 01-20-14.5 LF-MCG/0.5 injection Inject 0.5 mLs into the muscle once.    ? ?No current facility-administered medications for this visit.  ?  ? ?Review of Systems  ?  ?Chronic, mild lower extremity swelling.  He denies chest pain, dyspnea, palpitations, PND, orthopnea, dizziness, syncope, or early satiety.  All other systems reviewed and are otherwise negative except as noted above. ?  ? ?Physical Exam  ?  ?VS:  BP 110/70 (BP Location: Left Arm, Patient Position: Sitting, Cuff Size: Normal)   Pulse 63   Ht 5\' 8"  (1.727 m)   Wt 138 lb 8 oz (62.8 kg)   SpO2 96%   BMI 21.06 kg/m?  , BMI Body mass index is 21.06 kg/m?. ?    ?GEN: Well nourished, well developed, in no acute distress. ?HEENT: normal. ?Neck: Supple, no JVD, carotid bruits, or masses. ?Cardiac: RRR, no murmurs, rubs, or gallops. No clubbing, cyanosis, 1+ bilateral ankle edema.  Radials/PT 2+ and equal bilaterally.  ?Respiratory:  Respirations regular and unlabored, clear to auscultation bilaterally. ?GI: Soft, nontender, nondistended, BS + x 4. ?MS: no deformity or atrophy. ?Skin: warm and dry,  no rash. ?Neuro:  Strength and sensation are intact. ?Psych: Normal affect. ? ?Accessory Clinical Findings  ?  ?ECG personally reviewed by me today -regular sinus rhythm, 63- no acute changes. ? ?Labs dated October 22, 2021 from [facility] ? ?Hemoglobin 13.5, hematocrit 39.7, WBC 5.9, platelets 156 ?Sodium 138, potassium 4.1, chloride 104, CO2 28.0, BUN 12, creatinine 0.62, glucose 92 ?Calcium 9.3, albumin 3.5, total protein 6.2 ?Total bilirubin 1.6, alkaline phosphatase 95, AST 45, ALT 34 ?Total cholesterol 124, triglycerides 44, HDL 49, LDL 66 ? ?Assessment & Plan  ?  ?1.  Chronic heart failure with midrange ejection fraction/ischemic cardiomyopathy: EF previously 40 to 45% with most recent echo in March 2023 showing slight  improvement to 45 to 50% with global hypokinesis and normal RV function.  Patient is active at home and does not experience dyspnea, though he does have chronic, mild dependent ankle edema.  Weight has been stable and if anything, down since his initial diagnosis in 2014.  He has mild ankle edema on exam today but is otherwise euvolemic.  He is not currently on a diuretic, and we discussed use of compression socks.  He plans to go buy another pair, as he has been using a pair of diabetic socks and does not think they have been doing a very good job (he is not diabetic).  He was recently placed on Jardiance however, after thinking about it some, he is not interested in continuing this and plans to discontinue.  He otherwise remains on beta-blocker and ACE inhibitor therapy. ? ?2.  Coronary artery disease: Status post prior non-STEMI and LAD and diagonal stenting in April 2014.  He has done well since then.  He is very active without chest pain or dyspnea.  He remains on aspirin, statin, and beta-blocker therapy. ? ?3.  Hyperlipidemia: LDL of 66 in February.  Continue statin therapy. ? ?4.  History of PVCs: Quiescent today.  Continue beta-blocker. ? ?5.  Asthma: Well-controlled on Advair.  He does note  dyspnea if he forgets to use his Advair. ? ?6.  Disposition: Patient prefers to follow-up in 1 year.  He will contact us sooner if necessary. ? ?Nicolasa Ducking, NP ?01/16/2022, 9:11 AM ? ?

## 2022-04-05 ENCOUNTER — Other Ambulatory Visit: Payer: Self-pay | Admitting: Cardiovascular Disease

## 2022-10-06 ENCOUNTER — Other Ambulatory Visit: Payer: Self-pay | Admitting: Cardiovascular Disease

## 2023-01-04 ENCOUNTER — Other Ambulatory Visit: Payer: Self-pay | Admitting: Cardiovascular Disease

## 2023-01-21 ENCOUNTER — Encounter: Payer: Self-pay | Admitting: Cardiovascular Disease

## 2023-01-21 ENCOUNTER — Ambulatory Visit: Payer: Medicare HMO | Attending: Cardiovascular Disease | Admitting: Cardiovascular Disease

## 2023-01-21 ENCOUNTER — Ambulatory Visit: Payer: Medicare HMO | Admitting: Cardiovascular Disease

## 2023-01-21 VITALS — BP 118/60 | HR 76 | Ht 68.0 in | Wt 145.2 lb

## 2023-01-21 DIAGNOSIS — I2510 Atherosclerotic heart disease of native coronary artery without angina pectoris: Secondary | ICD-10-CM | POA: Diagnosis not present

## 2023-01-21 DIAGNOSIS — I255 Ischemic cardiomyopathy: Secondary | ICD-10-CM

## 2023-01-21 DIAGNOSIS — I493 Ventricular premature depolarization: Secondary | ICD-10-CM

## 2023-01-21 DIAGNOSIS — E785 Hyperlipidemia, unspecified: Secondary | ICD-10-CM | POA: Diagnosis not present

## 2023-01-21 NOTE — Progress Notes (Signed)
Cardiology Office Note   Date:  01/22/2023   ID:  [name] [name], DOB 08/30/1951, MRN [id number]  PCP:  Jenell Milliner, MD  Cardiologist:   Lorine Bears, MD   Chief Complaint  Patient presents with   Follow-up    12 month f/u no complaints today. Meds reviewed verbally with pt.      History of Present Illness: [name] [name] is a 72 y.o. male who presents for a followup visit regarding coronary artery disease with previous stenting of the LAD/diagonal. 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 He presented to [facility] on 12/28/12 with NSTEMI .  Cardiac catheterization showed 80% prox LAD s/p DES, 90% diagonal s/p DES, 30% prox RCA; EF 40%, mild anterolateral HK and mod apical HK.   Echocardiogram in July of 2014 showed an ejection fraction of 40-45% with mild mitral and aortic regurgitation. He is known to have asymptomatic PVCs.  Echocardiogram was repeated in March 2020 which showed stable ejection fraction of 40 to 45% with mild to moderate mitral regurgitation.  He has PVCs but overall is asymptomatic.  Most recent echocardiogram in March 2023 showed an EF of 45 to 50% with mild mitral regurgitation.  He has been doing well with no chest pain, shortness of breath or palpitations.   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 10/2012 Echo: EF 40-45%; b. 10/2018 Echo: EF 40-45%; c. 11/2021 Echo: EF 45-50%.   Coronary artery disease    a. 12/2012 NSTEMI/PCI: LM nl, LAD 80p (3.0x15 Xience DES), D1 90 (3.0x15 Xience DES), LCX/OM1/2/3 min irregs, RCA large, 30p, RPDA nl. EF 40%.   Gilbert's disease    mild   Hay fever    Hyperlipidemia    Ischemic cardiomyopathy    a. 10/2012 Echo: EF 40-45%; b. 10/2018 Echo: EF 40-45%; c. 11/2021 Echo: EF 45-50%, glob HK, nl RV fxn, mod enlarged RV, sev dil RA, mild MR/AI.   MI (myocardial infarction)  (HCC)    Nasal polyp    Nasal sinus congestion    Neuropathy    left arm   PVC's (premature ventricular contractions)     Past Surgical History:  Procedure Laterality Date   CARDIAC CATHETERIZATION  12/29/12   [facility];  80% prox LAD confirmed by IVUS s/p DES, 90% diagonal s/p DES, 30% prox RCA; EF 40%, mild anterolateral HK and mod apical HK   COLONOSCOPY  2019   6 polyps removed    NASAL POLYP SURGERY     x2   PROSTATE BIOPSY  2019     Current Outpatient Medications  Medication Sig Dispense Refill   ACETAMINOPHEN PO Take by mouth as needed.     aspirin 81 MG tablet Take 81 mg by mouth daily.     atorvastatin (LIPITOR) 80 MG tablet Take 1 tablet by mouth once daily 90 tablet 0   cetirizine (ZYRTEC) 10 MG tablet Take 10 mg by mouth daily.     fluticasone (FLONASE) 50 MCG/ACT nasal spray Place 1 spray into both nostrils daily as needed for allergies or rhinitis.     Fluticasone-Salmeterol (ADVAIR) 250-50 MCG/DOSE AEPB Inhale 1 puff into the lungs 2 (two) times daily.     ibuprofen (ADVIL,MOTRIN) 200 MG tablet Take 200 mg by mouth every 4 (four) hours as needed for pain.     INFLUENZA A, H1N1, MONOVAL PF IM Inject  into the muscle.     lisinopril (ZESTRIL) 5 MG tablet Take 1 tablet by mouth once daily 90 tablet 0   metoprolol tartrate (LOPRESSOR) 25 MG tablet Take 1/2 (one-half) tablet by mouth twice daily 90 tablet 0   nitroGLYCERIN (NITROSTAT) 0.4 MG SL tablet Place 1 tablet (0.4 mg total) under the tongue every 5 (five) minutes as needed for chest pain. 25 tablet 3   nystatin (MYCOSTATIN) 100000 UNIT/ML suspension Take 5 mLs by mouth in the morning.     TDaP (ADACEL) 01-20-14.5 LF-MCG/0.5 injection Inject 0.5 mLs into the muscle once.     No current facility-administered medications for this visit.    Allergies:   Dog epithelium (canis lupus familiaris), Dust mite extract, and Pollen extract    Social History:  The patient  reports that he has never smoked. He has never used  smokeless tobacco. He reports that he does not drink alcohol and does not use drugs.   Family History:  The patient's family history includes Heart attack in his father; Heart disease in his father.    ROS:  Please see the history of present illness.   Otherwise, review of systems are positive for none.   All other systems are reviewed and negative.    PHYSICAL EXAM: VS:  BP 118/60 (BP Location: Left Arm, Patient Position: Sitting, Cuff Size: Normal)   Pulse 76   Ht 5\' 8"  (1.727 m)   Wt 145 lb 4 oz (65.9 kg)   SpO2 99%   BMI 22.09 kg/m  , BMI Body mass index is 22.09 kg/m. GEN: Well nourished, well developed, in no acute distress  HEENT: normal  Neck: no JVD, carotid bruits, or masses Cardiac: RRR; no murmurs, rubs, or gallops,no edema  Respiratory:  clear to auscultation bilaterally, normal work of breathing GI: soft, nontender, nondistended, + BS MS: no deformity or atrophy  Skin: warm and dry, no rash Neuro:  Strength and sensation are intact Psych: euthymic mood, full affect   EKG:  EKG is ordered today. The ekg ordered today sinus rhythm with PVCs.   Recent Labs: No results found for requested labs within last 365 days.    Lipid Panel    Component Value Date/Time   CHOL 144 11/21/2020 1015   TRIG 37 11/21/2020 1015   HDL 59 11/21/2020 1015   CHOLHDL 2.4 11/21/2020 1015   VLDL 7 11/21/2020 1015   LDLCALC 78 11/21/2020 1015      Wt Readings from Last 3 Encounters:  01/21/23 145 lb 4 oz (65.9 kg)  01/16/22 138 lb 8 oz (62.8 kg)  11/26/21 147 lb 12 oz (67 kg)            No data to display            ASSESSMENT AND PLAN:  1.Coronary artery disease involving native coronary arteries without angina: He is doing extremely well. Continue medical therapy.  2. Mild ischemic cardiomyopathy with most recent ejection fraction of 45-50 %. Continue small dose metoprolol and lisinopril.  No evidence of volume overload.  3. Hyperlipidemia: I reviewed most  recent lipid profile done in February which showed an LDL of 71. Continue high-dose atorvastatin.  His AST was only slightly elevated at 55 with normal ALT.  4.  PVCs: He continues to be asymptomatic.    Disposition:   FU with me in 1 year  Signed,  Lorine Bears, MD  01/22/2023 10:02 AM    [facility]

## 2023-01-21 NOTE — Patient Instructions (Signed)
Medication Instructions:  No changes *If you need a refill on your cardiac medications before your next appointment, please call your pharmacy*   Lab Work: None ordered If you have labs (blood work) drawn today and your tests are completely normal, you will receive your results only by: MyChart Message (if you have MyChart) OR A paper copy in the mail If you have any lab test that is abnormal or we need to change your treatment, we will call you to review the results.   Testing/Procedures: None ordered   Follow-Up: At [facility], you and your health needs are our priority.  As part of our continuing mission to provide you with exceptional heart care, we have created designated Provider Care Teams.  These Care Teams include your primary Cardiologist (physician) and Advanced Practice Providers (APPs -  Physician Assistants and Nurse Practitioners) who all work together to provide you with the care you need, when you need it.  We recommend signing up for the patient portal called "MyChart".  Sign up information is provided on this After Visit Summary.  MyChart is used to connect with patients for Virtual Visits (Telemedicine).  Patients are able to view lab/test results, encounter notes, upcoming appointments, etc.  Non-urgent messages can be sent to your provider as well.   To learn more about what you can do with MyChart, go to [email or web address].    Your next appointment:   12 month(s)  Provider:   You may see Muhammad Arida, MD or one of the following Advanced Practice Providers on your designated Care Team:   Christopher Berge, NP Ryan Dunn, PA-C Cadence Furth, PA-C Sheri Hammock, NP    

## 2023-01-21 NOTE — Progress Notes (Deleted)
Cardiology Office Note   Date:  01/21/2023   ID:  [name] [name], DOB 10/07/1950, MRN [id number]  PCP:  Jenell Milliner, MD  Cardiologist:   Lorine Bears, MD   No chief complaint on file.     History of Present Illness: [name] [name] is a 72 y.o. male who presents for a followup visit regarding coronary artery disease with previous stenting of the LAD/diagonal. 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 He presented to [facility] on 12/28/12 with NSTEMI .  Cardiac catheterization showed 80% prox LAD s/p DES, 90% diagonal s/p DES, 30% prox RCA; EF 40%, mild anterolateral HK and mod apical HK.   Echocardiogram in July of 2014 showed an ejection fraction of 40-45% with mild mitral and aortic regurgitation. He is known to have asymptomatic PVCs.  Echocardiogram was repeated in March 2020 which showed stable ejection fraction of 40 to 45% with mild to moderate mitral regurgitation.   He is doing extremely well and denies any chest pain, shortness of breath or palpitations.  He is compliant with all his medications. He has PVCs but overall is asymptomatic.  He went to the emergency room few weeks ago with right flank pain with negative work-up.  The pain resolved and likely was musculoskeletal.   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 10/2012 Echo: EF 40-45%; b. 10/2018 Echo: EF 40-45%; c. 11/2021 Echo: EF 45-50%.   Coronary artery disease    a. 12/2012 NSTEMI/PCI: LM nl, LAD 80p (3.0x15 Xience DES), D1 90 (3.0x15 Xience DES), LCX/OM1/2/3 min irregs, RCA large, 30p, RPDA nl. EF 40%.   Gilbert's disease    mild   Hay fever    Hyperlipidemia    Ischemic cardiomyopathy    a. 10/2012 Echo: EF 40-45%; b. 10/2018 Echo: EF 40-45%; c. 11/2021 Echo: EF 45-50%, glob HK, nl RV fxn, mod enlarged RV, sev dil RA, mild MR/AI.   MI (myocardial infarction)  (HCC)    Nasal polyp    Nasal sinus congestion    Neuropathy    left arm   PVC's (premature ventricular contractions)     Past Surgical History:  Procedure Laterality Date   CARDIAC CATHETERIZATION  12/29/12   [facility];  80% prox LAD confirmed by IVUS s/p DES, 90% diagonal s/p DES, 30% prox RCA; EF 40%, mild anterolateral HK and mod apical HK   COLONOSCOPY  2019   6 polyps removed    NASAL POLYP SURGERY     x2   PROSTATE BIOPSY  2019     Current Outpatient Medications  Medication Sig Dispense Refill   ACETAMINOPHEN PO Take by mouth as needed.     aspirin 81 MG tablet Take 81 mg by mouth daily.     atorvastatin (LIPITOR) 80 MG tablet Take 1 tablet by mouth once daily 90 tablet 0   cetirizine (ZYRTEC) 10 MG tablet Take 10 mg by mouth daily.     fluticasone (FLONASE) 50 MCG/ACT nasal spray Place 1 spray into both nostrils daily as needed for allergies or rhinitis.     Fluticasone-Salmeterol (ADVAIR) 250-50 MCG/DOSE AEPB Inhale 1 puff into the lungs 2 (two) times daily.     ibuprofen (ADVIL,MOTRIN) 200 MG tablet Take 200 mg by mouth every 4 (four) hours as needed for pain.     INFLUENZA A, H1N1, MONOVAL PF IM Inject into the muscle.  lisinopril (ZESTRIL) 5 MG tablet Take 1 tablet by mouth once daily 90 tablet 0   metoprolol tartrate (LOPRESSOR) 25 MG tablet Take 1/2 (one-half) tablet by mouth twice daily 90 tablet 0   nitroGLYCERIN (NITROSTAT) 0.4 MG SL tablet Place 1 tablet (0.4 mg total) under the tongue every 5 (five) minutes as needed for chest pain. 25 tablet 3   nystatin (MYCOSTATIN) 100000 UNIT/ML suspension Take 5 mLs by mouth in the morning.     TDaP (ADACEL) 01-20-14.5 LF-MCG/0.5 injection Inject 0.5 mLs into the muscle once.     No current facility-administered medications for this visit.    Allergies:   Pollen extract    Social History:  The patient  reports that he has never smoked. He has never used smokeless tobacco. He reports that he does not drink alcohol and does  not use drugs.   Family History:  The patient's family history includes Heart attack in his father; Heart disease in his father.    ROS:  Please see the history of present illness.   Otherwise, review of systems are positive for none.   All other systems are reviewed and negative.    PHYSICAL EXAM: VS:  There were no vitals taken for this visit. , BMI There is no height or weight on file to calculate BMI. GEN: Well nourished, well developed, in no acute distress  HEENT: normal  Neck: no JVD, carotid bruits, or masses Cardiac: RRR; no murmurs, rubs, or gallops,no edema  Respiratory:  clear to auscultation bilaterally, normal work of breathing GI: soft, nontender, nondistended, + BS MS: no deformity or atrophy  Skin: warm and dry, no rash Neuro:  Strength and sensation are intact Psych: euthymic mood, full affect   EKG:  EKG is ordered today. The ekg ordered today sinus rhythm with PVCs.   Recent Labs: No results found for requested labs within last 365 days.    Lipid Panel    Component Value Date/Time   CHOL 144 11/21/2020 1015   TRIG 37 11/21/2020 1015   HDL 59 11/21/2020 1015   CHOLHDL 2.4 11/21/2020 1015   VLDL 7 11/21/2020 1015   LDLCALC 78 11/21/2020 1015      Wt Readings from Last 3 Encounters:  01/16/22 138 lb 8 oz (62.8 kg)  11/26/21 147 lb 12 oz (67 kg)  11/21/20 143 lb (64.9 kg)            No data to display            ASSESSMENT AND PLAN:  1.Coronary artery disease involving native coronary arteries without angina: He is doing extremely well. Continue medical therapy.  2. 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 Continue small dose metoprolol and lisinopril.  No evidence of volume overload.  3. Hyperlipidemia:  He is currently on high-dose atorvastatin 80 mg daily.  I am going to obtain a follow-up lipid and liver profile.  He did have slightly elevated liver enzymes on atorvastatin and that has to be  monitored.  4.  PVCs: He continues to be asymptomatic.  I will check magnesium level.   Disposition:   FU with me in 1 year  Signed,  Lorine Bears, MD  01/21/2023 3:49 PM     [facility]

## 2023-03-31 ENCOUNTER — Other Ambulatory Visit: Payer: Self-pay | Admitting: Cardiovascular Disease

## 2024-01-03 ENCOUNTER — Other Ambulatory Visit: Payer: Self-pay | Admitting: Cardiovascular Disease

## 2024-01-05 ENCOUNTER — Other Ambulatory Visit: Payer: Self-pay | Admitting: Cardiovascular Disease

## 2024-01-27 ENCOUNTER — Encounter: Payer: Self-pay | Admitting: Cardiovascular Disease

## 2024-01-27 ENCOUNTER — Ambulatory Visit: Attending: Cardiovascular Disease | Admitting: Cardiovascular Disease

## 2024-01-27 VITALS — BP 100/52 | HR 74 | Ht 68.0 in | Wt 150.6 lb

## 2024-01-27 DIAGNOSIS — I255 Ischemic cardiomyopathy: Secondary | ICD-10-CM | POA: Diagnosis not present

## 2024-01-27 DIAGNOSIS — I2510 Atherosclerotic heart disease of native coronary artery without angina pectoris: Secondary | ICD-10-CM | POA: Diagnosis not present

## 2024-01-27 DIAGNOSIS — E785 Hyperlipidemia, unspecified: Secondary | ICD-10-CM

## 2024-01-27 DIAGNOSIS — I493 Ventricular premature depolarization: Secondary | ICD-10-CM

## 2024-01-27 NOTE — Patient Instructions (Signed)
 Medication Instructions:  Your Physician recommend you continue on your current medication as directed.    *If you need a refill on your cardiac medications before your next appointment, please call your pharmacy*  Lab Work: No labs ordered today  If you have labs (blood work) drawn today and your tests are completely normal, you will receive your results only by: MyChart Message (if you have MyChart) OR A paper copy in the mail If you have any lab test that is abnormal or we need to change your treatment, we will call you to review the results.  Testing/Procedures: No test ordered today   Follow-Up: At [facility], you and your health needs are our priority.  As part of our continuing mission to provide you with exceptional heart care, our providers are all part of one team.  This team includes your primary Cardiologist (physician) and Advanced Practice Providers or APPs (Physician Assistants and Nurse Practitioners) who all work together to provide you with the care you need, when you need it.  Your next appointment:   1 year(s)  Provider:   Antionette Kirks, MD

## 2024-01-27 NOTE — Progress Notes (Signed)
 Cardiology Office Note   Date:  01/27/2024   ID:  [name] [name], DOB 1951-06-07, MRN [id number]  PCP:  Mai Schwalbe, FNP  Cardiologist:   Antionette Kirks, MD   No chief complaint on file.     History of Present Illness: [name] [name] is a 73 y.o. male who presents for a followup visit regarding coronary artery disease with previous stenting of the LAD/diagonal. 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 He was hospitalized in 2014 with non-ST elevation myocardial infarction.  Cardiac catheterization showed significant proximal LAD and diagonal disease.  Both were treated with PCI and drug-eluting stent placement.  EF was mildly reduced at 40% with anterior wall hypokinesis. He is known to have asymptomatic PVCs.    Echocardiogram and March 2023 showed an EF of 45 to 50% with mild mitral regurgitation.  He fell last year in November and was hospitalized at [facility] with hypoxia, right pleural effusion and suspected pneumonia.  He had subsequent right thoracentesis with improvement.  He had an echocardiogram done at [facility] in November which showed normal LV systolic function with mild mitral regurgitation.  He was placed on small dose furosemide 20 mg daily mostly for lower extremity edema.  He reports improvement in symptoms overall.  He denies any chest pain.   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 10/2012 Echo: EF 40-45%; b. 10/2018 Echo: EF 40-45%; c. 11/2021 Echo: EF 45-50%.   Coronary artery disease    a. 12/2012 NSTEMI/PCI: LM nl, LAD 80p (3.0x15 Xience DES), D1 90 (3.0x15 Xience DES), LCX/OM1/2/3 min irregs, RCA large, 30p, RPDA nl. EF 40%.   Gilbert's disease    mild   Hay fever    Hyperlipidemia    Ischemic cardiomyopathy    a. 10/2012 Echo: EF 40-45%; b. 10/2018 Echo: EF 40-45%; c. 11/2021 Echo: EF 45-50%, glob HK, nl RV fxn,  mod enlarged RV, sev dil RA, mild MR/AI.   MI (myocardial infarction) (HCC)    Nasal polyp    Nasal sinus congestion    Neuropathy    left arm   PVC's (premature ventricular contractions)     Past Surgical History:  Procedure Laterality Date   CARDIAC CATHETERIZATION  12/29/12   [facility];  80% prox LAD confirmed by IVUS s/p DES, 90% diagonal s/p DES, 30% prox RCA; EF 40%, mild anterolateral HK and mod apical HK   COLONOSCOPY  2019   6 polyps removed    NASAL POLYP SURGERY     x2   PROSTATE BIOPSY  2019     Current Outpatient Medications  Medication Sig Dispense Refill   ACETAMINOPHEN PO Take by mouth as needed.     aspirin 81 MG tablet Take 81 mg by mouth daily.     atorvastatin  (LIPITOR) 80 MG tablet Take 1 tablet by mouth once daily 90 tablet 0   cetirizine (ZYRTEC) 10 MG tablet Take 10 mg by mouth daily.     ciprofloxacin (CIPRO) 500 MG tablet Take 500 mg by mouth.     fluticasone (FLONASE) 50 MCG/ACT nasal spray Place 1 spray into both nostrils daily as needed for allergies or rhinitis.     furosemide (LASIX) 20 MG tablet Take 20 mg by mouth.     ibuprofen (ADVIL,MOTRIN) 200 MG tablet Take 200 mg by mouth every 4 (four) hours as needed for pain.  INFLUENZA A, H1N1, MONOVAL PF IM Inject into the muscle.     lisinopril  (ZESTRIL ) 5 MG tablet Take 1 tablet by mouth once daily 90 tablet 0   metoprolol  tartrate (LOPRESSOR ) 25 MG tablet Take 1/2 (one-half) tablet by mouth twice daily 90 tablet 0   nitroGLYCERIN  (NITROSTAT ) 0.4 MG SL tablet Place 1 tablet (0.4 mg total) under the tongue every 5 (five) minutes as needed for chest pain. 25 tablet 3   nystatin (MYCOSTATIN) 100000 UNIT/ML suspension Take 5 mLs by mouth in the morning.     tamsulosin (FLOMAX) 0.4 MG CAPS capsule Take 1 capsule by mouth daily.     TDaP (ADACEL) 01-20-14.5 LF-MCG/0.5 injection Inject 0.5 mLs into the muscle once.     Fluticasone-Salmeterol (ADVAIR) 250-50 MCG/DOSE AEPB Inhale 1 puff into the lungs 2 (two)  times daily. (Patient not taking: Reported on 01/27/2024)     No current facility-administered medications for this visit.    Allergies:   Dog epithelium (canis lupus familiaris), Dust mite extract, and Pollen extract    Social History:  The patient  reports that he has never smoked. He has never used smokeless tobacco. He reports that he does not drink alcohol and does not use drugs.   Family History:  The patient's family history includes Heart attack in his father; Heart disease in his father.    ROS:  Please see the history of present illness.   Otherwise, review of systems are positive for none.   All other systems are reviewed and negative.    PHYSICAL EXAM: VS:  BP (!) 100/52 (BP Location: Left Arm, Patient Position: Sitting, Cuff Size: Normal)   Pulse 74   Ht 5\' 8"  (1.727 m)   Wt 150 lb 9.6 oz (68.3 kg)   SpO2 98%   BMI 22.90 kg/m  , BMI Body mass index is 22.9 kg/m. GEN: Well nourished, well developed, in no acute distress  HEENT: normal  Neck: no JVD, carotid bruits, or masses Cardiac: RRR; no murmurs, rubs, or gallops, mild bilateral leg edema Respiratory:  clear to auscultation bilaterally, normal work of breathing GI: soft, nontender, nondistended, + BS MS: no deformity or atrophy  Skin: warm and dry, no rash Neuro:  Strength and sensation are intact Psych: euthymic mood, full affect   EKG:  EKG is ordered today. The ekg ordered today: Normal sinus rhythm Low voltage QRS Nonspecific ST abnormality When compared with ECG of 16-Nov-2019 09:56, No significant change was found    Recent Labs: No results found for requested labs within last 365 days.    Lipid Panel    Component Value Date/Time   CHOL 144 11/21/2020 1015   TRIG 37 11/21/2020 1015   HDL 59 11/21/2020 1015   CHOLHDL 2.4 11/21/2020 1015   VLDL 7 11/21/2020 1015   LDLCALC 78 11/21/2020 1015      Wt Readings from Last 3 Encounters:  01/27/24 150 lb 9.6 oz (68.3 kg)  01/21/23 145 lb 4 oz  (65.9 kg)  01/16/22 138 lb 8 oz (62.8 kg)            No data to display            ASSESSMENT AND PLAN:  1.Coronary artery disease involving native coronary arteries without angina: He is doing  well. Continue medical therapy.  2. Mild ischemic cardiomyopathy with : Previous ejection fraction of 45%.  Most recent echocardiogram in November at [facility] showed normal EF.  Continue small dose metoprolol  and lisinopril .  He did have right pleural effusion in November but that was likely due to pneumonia and not heart failure.  He is currently on small dose furosemide 20 mg daily mostly for lower extremity edema.  3. Hyperlipidemia: I reviewed most recent lipid profile done in February of 2024 which showed an LDL of 71. Continue high-dose atorvastatin .    4.  PVCs: He continues to be asymptomatic.  No evidence of PVCs by exam or EKG.   Disposition:   FU with me in 1 year  Signed,  Antionette Kirks, MD  01/27/2024 12:59 PM    [facility]

## 2024-02-01 ENCOUNTER — Ambulatory Visit: Payer: Medicare HMO | Admitting: Cardiovascular Disease

## 2024-02-09 ENCOUNTER — Encounter: Payer: Self-pay | Admitting: Cardiovascular Disease

## 2024-02-09 DIAGNOSIS — Z79899 Other long term (current) drug therapy: Secondary | ICD-10-CM

## 2024-02-11 MED ORDER — ROSUVASTATIN CALCIUM 40 MG PO TABS: 40.0000 mg | ORAL_TABLET | Freq: Every day | ORAL | 3 refills | Status: DC

## 2024-03-29 ENCOUNTER — Encounter: Payer: Self-pay | Admitting: Cardiovascular Disease

## 2024-03-30 ENCOUNTER — Ambulatory Visit: Payer: Self-pay | Admitting: Cardiovascular Disease

## 2024-03-30 DIAGNOSIS — Z79899 Other long term (current) drug therapy: Secondary | ICD-10-CM

## 2024-03-30 DIAGNOSIS — E785 Hyperlipidemia, unspecified: Secondary | ICD-10-CM

## 2024-03-30 LAB — HEPATIC FUNCTION PANEL
ALT: 55 IU/L — ABNORMAL HIGH (ref 0–44)
AST: 55 IU/L — ABNORMAL HIGH (ref 0–40)
Albumin: 3.9 g/dL (ref 3.8–4.8)
Alkaline Phosphatase: 73 IU/L (ref 44–121)
Bilirubin Total: 1.3 mg/dL — ABNORMAL HIGH (ref 0.0–1.2)
Bilirubin, Direct: 0.35 mg/dL (ref 0.00–0.40)
Total Protein: 6 g/dL (ref 6.0–8.5)

## 2024-03-30 LAB — LDL CHOLESTEROL, DIRECT: LDL Direct: 87 mg/dL (ref 0–99)

## 2024-03-30 MED ORDER — ROSUVASTATIN CALCIUM 10 MG PO TABS: 10.0000 mg | ORAL_TABLET | Freq: Every day | ORAL | 3 refills | Status: AC

## 2024-03-30 MED ORDER — EZETIMIBE 10 MG PO TABS: 10.0000 mg | ORAL_TABLET | Freq: Every day | ORAL | 3 refills | Status: AC

## 2024-04-13 ENCOUNTER — Other Ambulatory Visit: Payer: Self-pay | Admitting: Cardiovascular Disease

## 2024-06-02 LAB — HEPATIC FUNCTION PANEL
ALT: 57 IU/L — ABNORMAL HIGH (ref 0–44)
AST: 58 IU/L — ABNORMAL HIGH (ref 0–40)
Albumin: 3.8 g/dL (ref 3.8–4.8)
Alkaline Phosphatase: 58 IU/L (ref 44–121)
Bilirubin Total: 1.6 mg/dL — ABNORMAL HIGH (ref 0.0–1.2)
Bilirubin, Direct: 0.46 mg/dL — ABNORMAL HIGH (ref 0.00–0.40)
Total Protein: 5.5 g/dL — ABNORMAL LOW (ref 6.0–8.5)

## 2024-06-02 LAB — LIPID PANEL
Chol/HDL Ratio: 2.2 ratio (ref 0.0–5.0)
Cholesterol, Total: 108 mg/dL (ref 100–199)
HDL: 49 mg/dL (ref >39)
LDL Chol Calc (NIH): 48 mg/dL (ref 0–99)
Triglycerides: 41 mg/dL (ref 0–149)
VLDL Cholesterol Cal: 11 mg/dL (ref 5–40)
